# Patient Record
Sex: Female | Born: 1940 | Race: White | Hispanic: No | Smoking: Never smoker
Health system: Southern US, Community
[De-identification: ages and names within clinical notes are randomized; demographics above are authoritative.]

## PROBLEM LIST (undated history)

## (undated) DIAGNOSIS — I10 Essential (primary) hypertension: Secondary | ICD-10-CM

## (undated) DIAGNOSIS — F419 Anxiety disorder, unspecified: Secondary | ICD-10-CM

## (undated) DIAGNOSIS — S42353K Displaced comminuted fracture of shaft of humerus, unspecified arm, subsequent encounter for fracture with nonunion: Secondary | ICD-10-CM

## (undated) DIAGNOSIS — M25559 Pain in unspecified hip: Secondary | ICD-10-CM

## (undated) DIAGNOSIS — G2581 Restless legs syndrome: Secondary | ICD-10-CM

## (undated) DIAGNOSIS — F321 Major depressive disorder, single episode, moderate: Secondary | ICD-10-CM

## (undated) DIAGNOSIS — E559 Vitamin D deficiency, unspecified: Secondary | ICD-10-CM

## (undated) DIAGNOSIS — M1990 Unspecified osteoarthritis, unspecified site: Secondary | ICD-10-CM

## (undated) DIAGNOSIS — G8929 Other chronic pain: Secondary | ICD-10-CM

## (undated) HISTORY — DX: Major depressive disorder, single episode, moderate: F32.1

## (undated) HISTORY — PX: FRACTURE SURGERY: SHX138

---

## 2012-01-27 ENCOUNTER — Emergency Department (HOSPITAL_COMMUNITY): Payer: Medicare Other

## 2012-01-27 ENCOUNTER — Emergency Department (HOSPITAL_COMMUNITY)
Admission: EM | Admit: 2012-01-27 | Discharge: 2012-01-27 | Disposition: A | Discharge status: Home / Self Care | Payer: Medicare Other | Attending: Emergency Medicine | Admitting: Emergency Medicine

## 2012-01-27 ENCOUNTER — Encounter (HOSPITAL_COMMUNITY): Payer: Self-pay | Admitting: *Deleted

## 2012-01-27 DIAGNOSIS — I10 Essential (primary) hypertension: Secondary | ICD-10-CM | POA: Insufficient documentation

## 2012-01-27 DIAGNOSIS — R269 Unspecified abnormalities of gait and mobility: Secondary | ICD-10-CM | POA: Insufficient documentation

## 2012-01-27 DIAGNOSIS — R42 Dizziness and giddiness: Secondary | ICD-10-CM

## 2012-01-27 MED ORDER — MECLIZINE HCL 25 MG PO TABS: 25.0000 mg | ORAL_TABLET | Freq: Four times a day (QID) | ORAL | Status: AC

## 2012-01-27 MED ORDER — MECLIZINE HCL 25 MG PO TABS
25.0000 mg | ORAL_TABLET | Freq: Once | ORAL | Status: AC
  Administered 2012-01-27: 25 mg via ORAL
  Filled 2012-01-27: qty 1

## 2012-01-27 MED ORDER — LORAZEPAM 2 MG/ML IJ SOLN
1.0000 mg | Freq: Once | INTRAMUSCULAR | Status: AC
  Administered 2012-01-27: 1 mg via INTRAVENOUS
  Filled 2012-01-27: qty 1

## 2012-01-27 MED ORDER — LORAZEPAM 1 MG PO TABS: 1.0000 mg | ORAL_TABLET | Freq: Once | ORAL | Status: DC

## 2012-01-27 NOTE — ED Notes (Signed)
Pt's son has stepped out of room stating she is easily stressed out and always has been.  He states she won't go to the doctor for help w/it.

## 2012-01-27 NOTE — ED Notes (Signed)
Pt reports having some dizziness beginning yesterday morning. Got worse this am to where pt reports she was so dizzy she couldn't stand. Pt A&O in triage, sitting in WC.

## 2012-01-27 NOTE — ED Notes (Addendum)
Pt states all she drank yesterday was 2 cups of coffee and a can of diet pepsi which was what she drank Sunday. She did some moving of boxes of clothes and linens on Sunday but wasn't working in the heat at all.  Speech is clear, grips equal, facial symmetry noted. Family at b/s hasn't noticed any neurological abnormalities.

## 2012-01-27 NOTE — ED Provider Notes (Signed)
History     CSN: [id number]  Arrival date & time 01/27/12  1020   First MD Initiated Contact with Patient 01/27/12 1053      Chief Complaint  Patient presents with  . Dizziness    The history is provided by the patient.   the patient reports developing which she describes as "dizziness" yesterday.  She is unable to describe this further except that she feels "drunk".  She reports difficulty ambulating or walking because of instability on her feet.  She has a history of hypertension but stopped taking medications 6 years ago and has not followed up with her primary care physician since then.  She does not know her cholesterol status.  She does not smoke cigarettes.  The last time she was checked is not diabetic.  Denies weakness of her upper lower extremities.  She's had no change in her speech.  Her symptoms do seem to worsen with standing and movement of her head to the left and right.  She has no history of vertigo no prior history of stroke.  Family reports normal mentation this time.  Nothing improves her symptoms.  Her symptoms are constant.  History reviewed. No pertinent past medical history.  History reviewed. No pertinent past surgical history.  No family history on file.  History  Substance Use Topics  . Smoking status: Never Smoker   . Smokeless tobacco: Not on file  . Alcohol Use: No    OB History    Grav Para Term Preterm Abortions TAB SAB Ect Mult Living                  Review of Systems  All other systems reviewed and are negative.    Allergies  Penicillins  Home Medications  No current outpatient prescriptions on file.  BP 165/91  Pulse 84  Temp 98.7 F (37.1 C) (Oral)  Resp 18  SpO2 91%  Physical Exam  Nursing note and vitals reviewed. Constitutional: She is oriented to person, place, and time. She appears well-developed and well-nourished. No distress.  HENT:  Head: Normocephalic and atraumatic.  Eyes: EOM are normal. Pupils are equal,  round, and reactive to light.  Neck: Normal range of motion.  Cardiovascular: Normal rate, regular rhythm and normal heart sounds.   Pulmonary/Chest: Effort normal and breath sounds normal.  Abdominal: Soft. She exhibits no distension. There is no tenderness.  Musculoskeletal: Normal range of motion.  Neurological: She is alert and oriented to person, place, and time.       5/5 strength in major muscle groups of  bilateral upper and lower extremities. Speech normal. No facial asymetry.  Normal finger to nose.  Some gait instability on bedside testing  Skin: Skin is warm and dry.  Psychiatric: She has a normal mood and affect. Judgment normal.    ED Course  Procedures (including critical care time)  Labs Reviewed - No data to display Mr Brain Wo Contrast  01/27/2012  *RADIOLOGY REPORT*  Clinical Data: Sudden onset of dizziness yesterday progressing overnight and this morning.  Unable to any weight due to the dizziness.  MRI HEAD WITHOUT CONTRAST  Technique:  Multiplanar, multiecho pulse sequences of the brain and surrounding structures were obtained according to standard protocol without intravenous contrast.  Comparison: None.  Findings: The diffusion weighted images demonstrate no evidence for acute or subacute infarction.  Mild confluent periventricular and scattered subcortical T2 and FLAIR hyperintensities are present bilaterally.  Mild generalized atrophy is noted.  The ventricles  are proportionate to the degree of atrophy.  No significant extra- axial fluid collections are present.  There is no hemorrhage or mass lesion.  Flow is present in the major intracranial arteries.  The globes and orbits are intact.  Mild mucosal thickening is present in the ethmoid air cells.  The paranasal sinuses are otherwise clear.  There is fluid in the left mastoid air cells.  No obstructing nasopharyngeal lesion is evident.  IMPRESSION:  1.  No acute or focal abnormality to explain the patient's dizziness. 2.   Moderate atrophy and diffuse white matter changes likely reflecting sequelae of chronic microvascular ischemia. 3.  Left mastoid effusion.  No obstructing nasopharyngeal lesion is evident.  Original Report Authenticated By: Jamesetta Orleans. MATTERN, M.D.     I personally reviewed the imaging tests through PACS system  I reviewed available ER/hospitalization records thought the EMR   1. Vertigo       MDM  Given gait instability and symptoms of dizziness without a clear etiology besides peripheral vertigo the MRI scan will be obtained to evaluate for possible posterior stroke.  My suspicion is low at this point but high enough to obtain images.  The patient be given Ativan and Antivert at this time  2:15 PM The patient feels much better at this time.  Her MRI demonstrates no evidence of acute stroke.  Discharge home in good condition.  Home with Antivert  Lyanne Co, MD 01/27/12 1415

## 2012-01-27 NOTE — ED Notes (Signed)
Patient transported back from MRI on stretcher.  Now she is up w/ standby assist to use the bathroom.

## 2012-01-29 ENCOUNTER — Emergency Department (HOSPITAL_COMMUNITY): Payer: Medicare Other

## 2012-01-29 ENCOUNTER — Emergency Department (HOSPITAL_COMMUNITY)
Admission: EM | Admit: 2012-01-29 | Discharge: 2012-01-29 | Disposition: A | Discharge status: Home / Self Care | Payer: Medicare Other | Attending: Emergency Medicine | Admitting: Emergency Medicine

## 2012-01-29 ENCOUNTER — Encounter (HOSPITAL_COMMUNITY): Payer: Self-pay | Admitting: Emergency Medicine

## 2012-01-29 DIAGNOSIS — R42 Dizziness and giddiness: Secondary | ICD-10-CM

## 2012-01-29 DIAGNOSIS — Z79899 Other long term (current) drug therapy: Secondary | ICD-10-CM | POA: Insufficient documentation

## 2012-01-29 HISTORY — DX: Unspecified osteoarthritis, unspecified site: M19.90

## 2012-01-29 LAB — CBC WITH DIFFERENTIAL/PLATELET
Hemoglobin: 14.3 g/dL (ref 12.0–15.0)
Lymphs Abs: 2.3 10*3/uL (ref 0.7–4.0)
Monocytes Relative: 12 % (ref 3–12)
Neutro Abs: 3.9 10*3/uL (ref 1.7–7.7)
Neutrophils Relative %: 54 % (ref 43–77)
RBC: 5.01 MIL/uL (ref 3.87–5.11)

## 2012-01-29 LAB — URINALYSIS, ROUTINE W REFLEX MICROSCOPIC
Bilirubin Urine: NEGATIVE
Glucose, UA: NEGATIVE mg/dL
Ketones, ur: NEGATIVE mg/dL
Leukocytes, UA: NEGATIVE
Protein, ur: NEGATIVE mg/dL

## 2012-01-29 LAB — COMPREHENSIVE METABOLIC PANEL
Albumin: 3.9 g/dL (ref 3.5–5.2)
Alkaline Phosphatase: 112 U/L (ref 39–117)
BUN: 10 mg/dL (ref 6–23)
Chloride: 105 mEq/L (ref 96–112)
Glucose, Bld: 106 mg/dL — ABNORMAL HIGH (ref 70–99)
Potassium: 3.8 mEq/L (ref 3.5–5.1)
Total Bilirubin: 0.3 mg/dL (ref 0.3–1.2)

## 2012-01-29 MED ORDER — SODIUM CHLORIDE 0.9 % IV BOLUS (SEPSIS)
1000.0000 mL | Freq: Once | INTRAVENOUS | Status: AC
  Administered 2012-01-29: 1000 mL via INTRAVENOUS

## 2012-01-29 MED ORDER — LORAZEPAM 2 MG/ML IJ SOLN
1.0000 mg | Freq: Once | INTRAMUSCULAR | Status: AC
  Administered 2012-01-29: 1 mg via INTRAVENOUS
  Filled 2012-01-29: qty 1

## 2012-01-29 MED ORDER — LORAZEPAM 1 MG PO TABS: 1.0000 mg | ORAL_TABLET | Freq: Three times a day (TID) | ORAL | Status: AC | PRN

## 2012-01-29 NOTE — ED Notes (Signed)
Patient transported to XR. 

## 2012-01-29 NOTE — ED Provider Notes (Signed)
History     CSN: [id number]  Arrival date & time 01/29/12  1226   First MD Initiated Contact with Patient 01/29/12 1231      Chief Complaint  Patient presents with  . Dizziness    (Consider location/radiation/quality/duration/timing/severity/associated sxs/prior treatment) HPI The patient presents with dizziness.  She notes that symptoms began 3 days ago, insidiously.  Since onset she has had persistent generalized sense of discomfort.  She notes that her symptoms are most prominent when trying to ambulate or move.  She describes a sense of disequilibrium without near-syncope.  She denies any focal pain anywhere.  Her symptoms were mildly improved with interventions she received during her first emergency department visit 2 days ago.  She notes that since that visit her symptoms have been persistent.  She denies any concurrent confusion, disorientation him a hearing loss, nausea, vomiting, diarrhea.  Past Medical History  Diagnosis Date  . Arthritis     History reviewed. No pertinent past surgical history.  No family history on file.  History  Substance Use Topics  . Smoking status: Never Smoker   . Smokeless tobacco: Not on file  . Alcohol Use: No    OB History    Grav Para Term Preterm Abortions TAB SAB Ect Mult Living                  Review of Systems  Constitutional:       HPI  HENT:       HPI otherwise negative  Eyes: Negative.   Respiratory:       HPI, otherwise negative  Cardiovascular:       HPI, otherwise nmegative  Gastrointestinal: Negative for vomiting.  Genitourinary:       HPI, otherwise negative  Musculoskeletal:       HPI, otherwise negative  Skin: Negative.   Neurological: Positive for dizziness. Negative for tremors, seizures, syncope, facial asymmetry, speech difficulty, weakness, light-headedness, numbness and headaches.    Allergies  Penicillins  Home Medications   Current Outpatient Rx  Name Route Sig Dispense Refill  .  MECLIZINE HCL 25 MG PO TABS Oral Take 1 tablet (25 mg total) by mouth 4 (four) times daily. 28 tablet 0    BP 163/106  Pulse 113  Temp 97.9 F (36.6 C) (Oral)  Resp 20  SpO2 94%  Physical Exam  Nursing note and vitals reviewed. Constitutional: She is oriented to person, place, and time. She appears well-developed and well-nourished. No distress.       Obese elderly female  HENT:  Head: Normocephalic and atraumatic.  Eyes: EOM are normal. Pupils are equal, round, and reactive to light.  Neck: Normal range of motion.  Cardiovascular: Normal rate, regular rhythm and normal heart sounds.   Pulmonary/Chest: Effort normal and breath sounds normal.  Abdominal: Soft. She exhibits no distension. There is no tenderness.  Musculoskeletal: Normal range of motion.  Neurological: She is alert and oriented to person, place, and time. No cranial nerve deficit. She exhibits normal muscle tone. Coordination normal.       5/5 strength in major muscle groups of  bilateral upper and lower extremities. Speech normal. No facial asymetry.  Normal finger to nose.  Some gait instability on bedside testing, and if the patient is unwilling to take more than minimal steps  Skin: Skin is warm and dry.  Psychiatric: She has a normal mood and affect. Judgment normal.    ED Course  Procedures (including critical care time)   Labs  Reviewed  CBC WITH DIFFERENTIAL  COMPREHENSIVE METABOLIC PANEL  URINALYSIS, ROUTINE W REFLEX MICROSCOPIC  TROPONIN I   Mr Brain Wo Contrast  01/27/2012  *RADIOLOGY REPORT*  Clinical Data: Sudden onset of dizziness yesterday progressing overnight and this morning.  Unable to any weight due to the dizziness.  MRI HEAD WITHOUT CONTRAST  Technique:  Multiplanar, multiecho pulse sequences of the brain and surrounding structures were obtained according to standard protocol without intravenous contrast.  Comparison: None.  Findings: The diffusion weighted images demonstrate no evidence for  acute or subacute infarction.  Mild confluent periventricular and scattered subcortical T2 and FLAIR hyperintensities are present bilaterally.  Mild generalized atrophy is noted.  The ventricles are proportionate to the degree of atrophy.  No significant extra- axial fluid collections are present.  There is no hemorrhage or mass lesion.  Flow is present in the major intracranial arteries.  The globes and orbits are intact.  Mild mucosal thickening is present in the ethmoid air cells.  The paranasal sinuses are otherwise clear.  There is fluid in the left mastoid air cells.  No obstructing nasopharyngeal lesion is evident.  IMPRESSION:  1.  No acute or focal abnormality to explain the patient's dizziness. 2.  Moderate atrophy and diffuse white matter changes likely reflecting sequelae of chronic microvascular ischemia. 3.  Left mastoid effusion.  No obstructing nasopharyngeal lesion is evident.  Original Report Authenticated By: Jamesetta Orleans. MATTERN, M.D.     No diagnosis found.  Cardiac 91 sinus rhythm normal Pulse oximetry 100% room air normal   Date: 01/29/2012  Rate: 98  Rhythm: normal sinus rhythm  QRS Axis: left  Intervals: PR prolonged  ST/T Wave abnormalities: nonspecific T wave changes  Conduction Disutrbances:left anterior fascicular block  Narrative Interpretation:   Old EKG Reviewed: changes noted  ABNORMAL  1:09 PM The patient does not tolerate blood pressure cuff.  3:32 PM Patient notes that her dysequilibrium is gone.  MDM  This 71 year old female presents for second time in several days with concerns of ongoing disequilibrium.  On my exam the patient was in no distress, with an unremarkable neurologic exam.  The patient was hesitant to walk to 2 stated instability, but can be arranged, can take a few steps.  Given that the patient already had an MRI, and notes no new confusion or focal neurologic changes, this exam was not repeated.  The patient's evaluation for vertigo  was completed with labs, chest x-ray, ecg.  All of these results were reassuring.  The patient had significant improvement with ED interventions.  Given this improvement, the absence of acute findings, the patient is appropriate to continue the evaluation of her vertigo as an outpatient.  I spent a considerable time discussing all findings with the patient and her son.  The patient's son requests blood pressure medication, the patient's pressures here have been mildly hypertensive at best.  The patient notes that her blood pressure at home is typically 120/80.  She was provided with pressure medication.  She was given return precautions, follow up instructions, and discharged in stable condition.  Requested something for anxiety and control of symptoms.  Ativan will provide this as well as control of her vertigo.  Gerhard Munch, MD 01/29/12 1536

## 2012-01-29 NOTE — ED Notes (Signed)
Pt presenting to ed with c/o dizziness pt states she was seen here x 2 days ago for the same symptoms. Pt states some blurred vision. Pt denies nausea and vomiting. Pt states she can not ambulate. Pt states she was given antivert but it's not helping

## 2012-07-14 HISTORY — PX: CATARACT EXTRACTION W/ INTRAOCULAR LENS IMPLANT: SHX1309

## 2013-06-18 IMAGING — CR DG CHEST 2V
2 series · 2 of 2 positions shown · non-contrast
Comparison: None.

CLINICAL DATA: Discomfort and dizziness.

CHEST - 2 VIEW

[w chest pa]
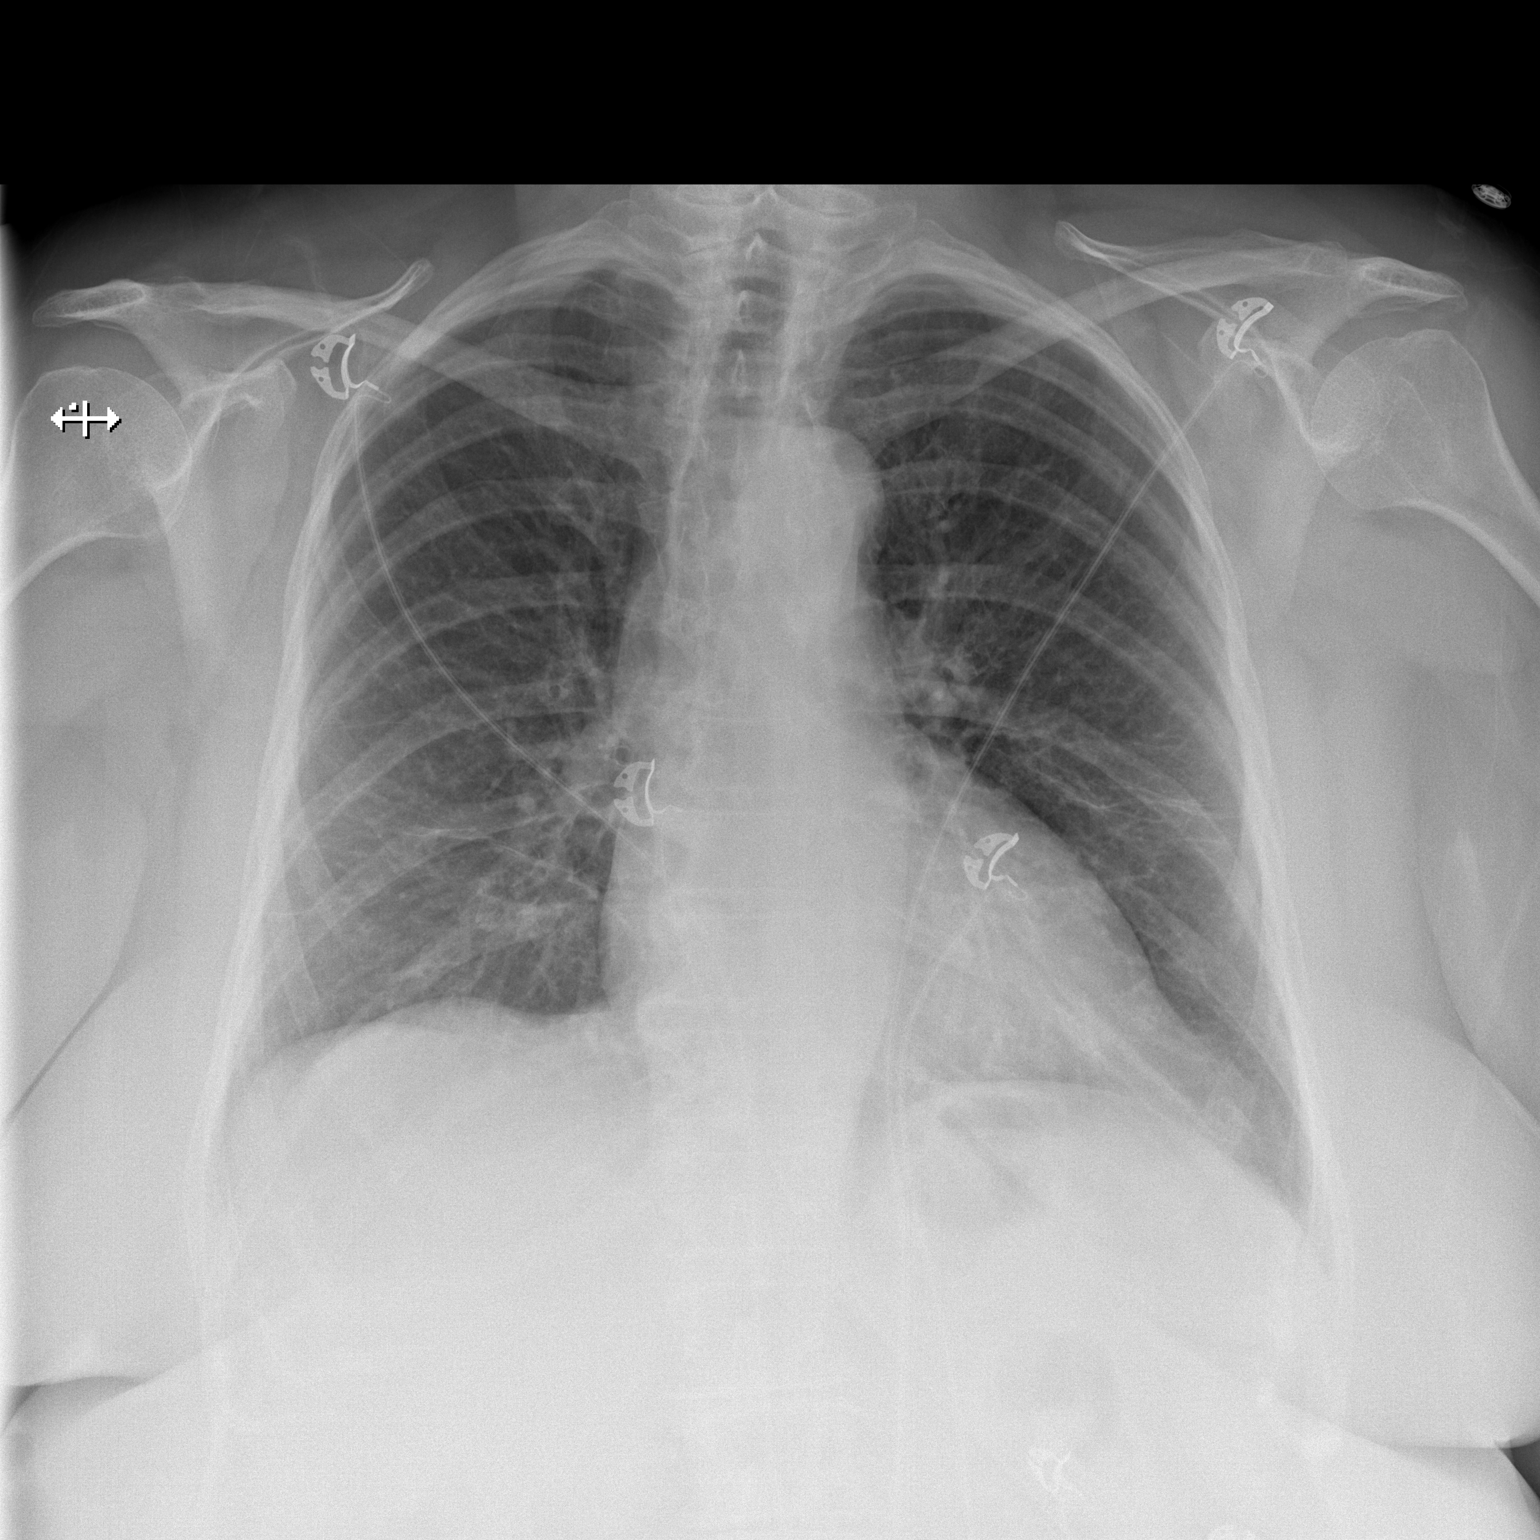

[w chest lat]
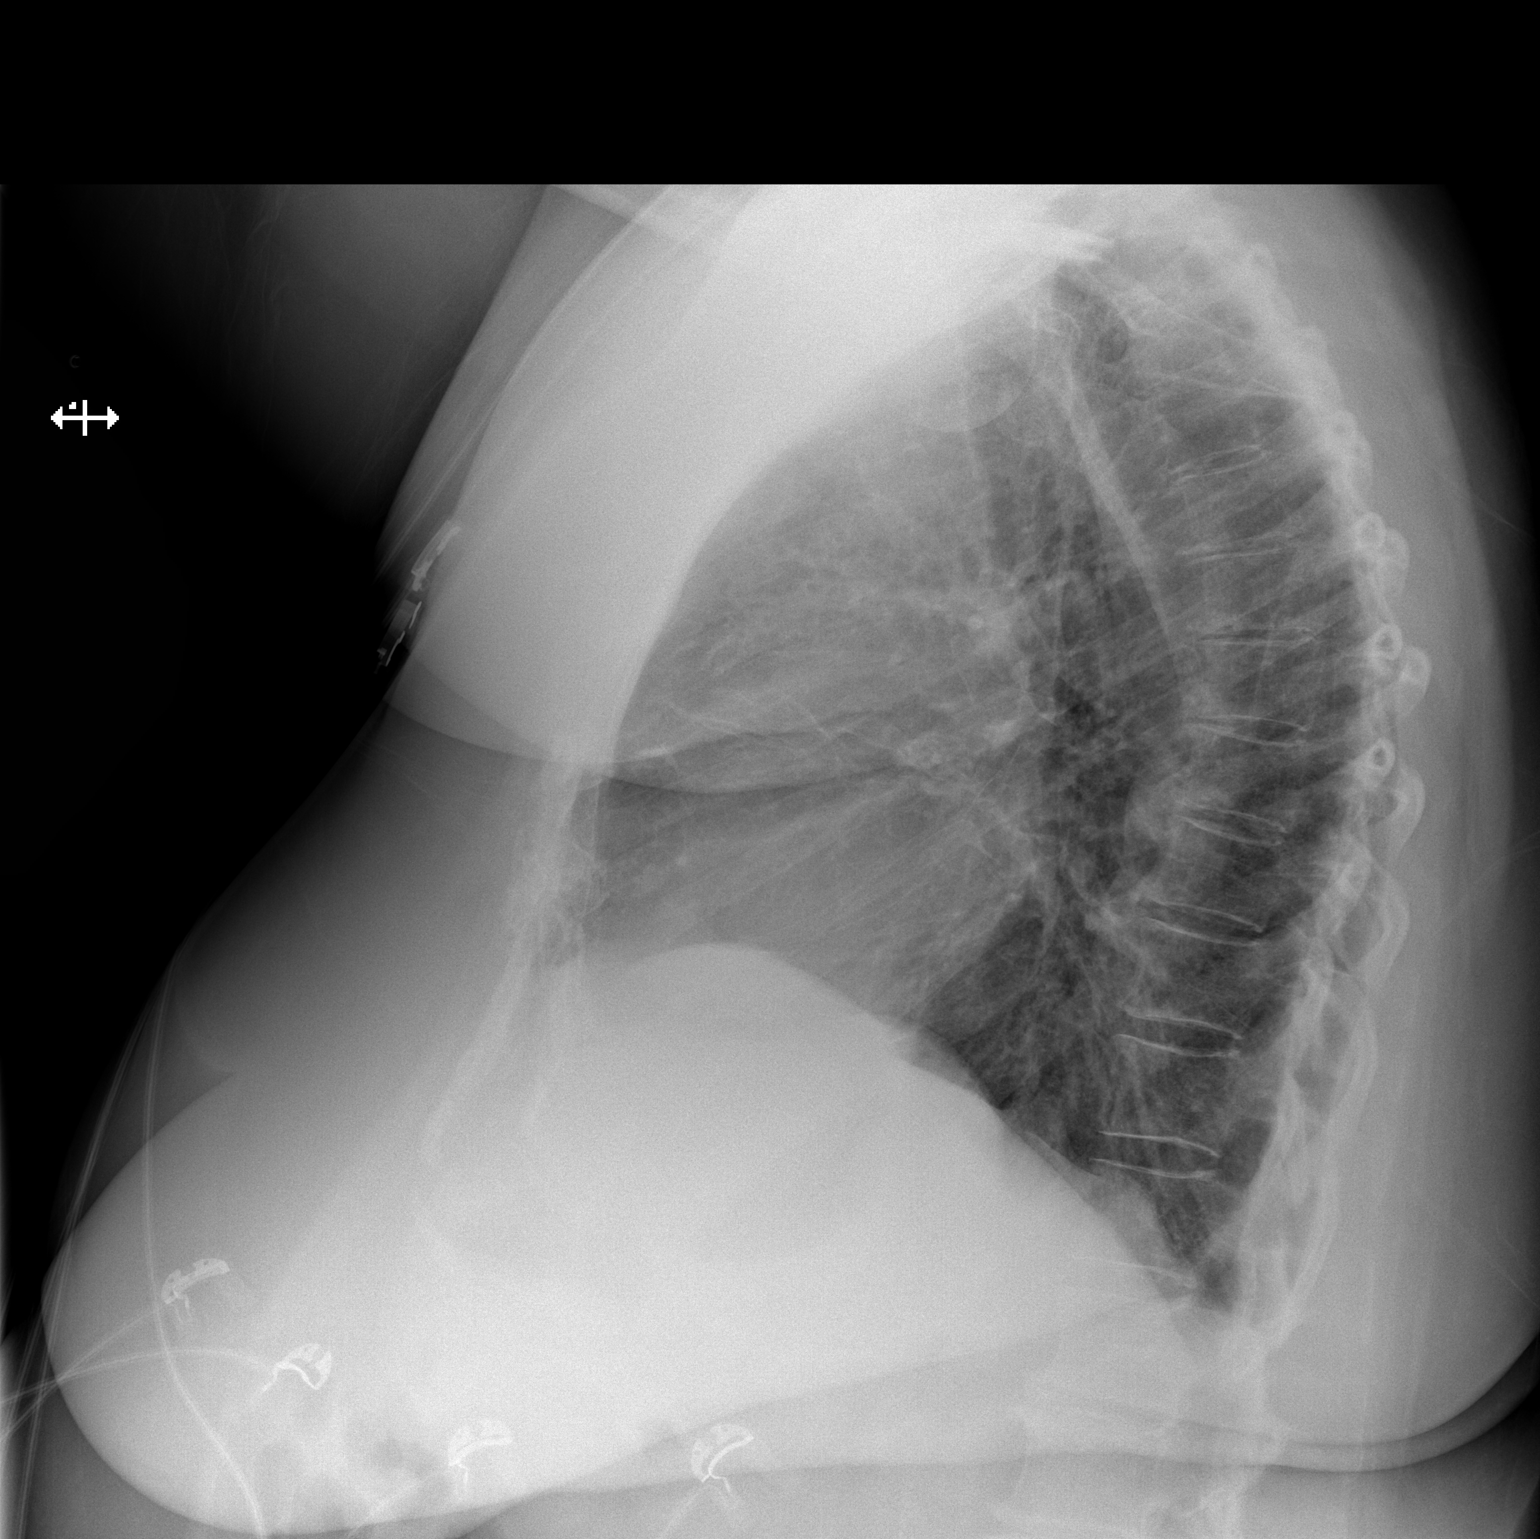

[2 of 2 positions shown; findings below may reference images not displayed]

FINDINGS: Trachea is midline.  Heart size normal.  Linear scarring
in the mid and lower lung zones.  Lungs are otherwise clear.  No
pleural fluid.
IMPRESSION: No acute findings.

## 2016-04-25 ENCOUNTER — Encounter (HOSPITAL_COMMUNITY): Payer: Self-pay | Admitting: *Deleted

## 2016-04-25 DIAGNOSIS — M898X9 Other specified disorders of bone, unspecified site: Secondary | ICD-10-CM | POA: Diagnosis present

## 2016-04-25 DIAGNOSIS — R0902 Hypoxemia: Secondary | ICD-10-CM | POA: Diagnosis present

## 2016-04-25 DIAGNOSIS — S42201A Unspecified fracture of upper end of right humerus, initial encounter for closed fracture: Principal | ICD-10-CM | POA: Diagnosis present

## 2016-04-25 DIAGNOSIS — E559 Vitamin D deficiency, unspecified: Secondary | ICD-10-CM | POA: Diagnosis present

## 2016-04-25 DIAGNOSIS — I10 Essential (primary) hypertension: Secondary | ICD-10-CM | POA: Diagnosis present

## 2016-04-25 DIAGNOSIS — W1830XA Fall on same level, unspecified, initial encounter: Secondary | ICD-10-CM | POA: Diagnosis present

## 2016-04-25 DIAGNOSIS — G2581 Restless legs syndrome: Secondary | ICD-10-CM | POA: Diagnosis present

## 2016-04-25 DIAGNOSIS — B9620 Unspecified Escherichia coli [E. coli] as the cause of diseases classified elsewhere: Secondary | ICD-10-CM | POA: Diagnosis present

## 2016-04-25 DIAGNOSIS — Z880 Allergy status to penicillin: Secondary | ICD-10-CM

## 2016-04-25 DIAGNOSIS — D62 Acute posthemorrhagic anemia: Secondary | ICD-10-CM | POA: Diagnosis not present

## 2016-04-25 DIAGNOSIS — N390 Urinary tract infection, site not specified: Secondary | ICD-10-CM | POA: Diagnosis present

## 2016-04-25 DIAGNOSIS — Z6841 Body Mass Index (BMI) 40.0 and over, adult: Secondary | ICD-10-CM

## 2016-04-28 ENCOUNTER — Ambulatory Visit (HOSPITAL_COMMUNITY): Payer: Medicare Other

## 2016-04-28 ENCOUNTER — Encounter (HOSPITAL_COMMUNITY)
Admission: RE | Disposition: A | Discharge status: Skilled Nursing Facility | Payer: Self-pay | Source: Ambulatory Visit | Attending: Orthopedic Surgery

## 2016-04-28 ENCOUNTER — Encounter (HOSPITAL_COMMUNITY): Payer: Self-pay | Admitting: Anesthesiology

## 2016-04-28 ENCOUNTER — Ambulatory Visit (HOSPITAL_COMMUNITY): Payer: Medicare Other | Admitting: Anesthesiology

## 2016-04-28 ENCOUNTER — Inpatient Hospital Stay (HOSPITAL_COMMUNITY)
Admission: RE | Admit: 2016-04-28 | Discharge: 2016-04-30 | DRG: 493 | Disposition: A | Discharge status: Skilled Nursing Facility | Payer: Medicare Other | Source: Ambulatory Visit | Attending: Orthopedic Surgery | Admitting: Orthopedic Surgery

## 2016-04-28 ENCOUNTER — Inpatient Hospital Stay (HOSPITAL_COMMUNITY): Payer: Medicare Other

## 2016-04-28 DIAGNOSIS — S42351A Displaced comminuted fracture of shaft of humerus, right arm, initial encounter for closed fracture: Secondary | ICD-10-CM | POA: Diagnosis present

## 2016-04-28 DIAGNOSIS — S42201A Unspecified fracture of upper end of right humerus, initial encounter for closed fracture: Secondary | ICD-10-CM | POA: Diagnosis present

## 2016-04-28 DIAGNOSIS — N390 Urinary tract infection, site not specified: Secondary | ICD-10-CM | POA: Diagnosis not present

## 2016-04-28 DIAGNOSIS — R0902 Hypoxemia: Secondary | ICD-10-CM | POA: Diagnosis not present

## 2016-04-28 DIAGNOSIS — D62 Acute posthemorrhagic anemia: Secondary | ICD-10-CM | POA: Diagnosis not present

## 2016-04-28 DIAGNOSIS — E559 Vitamin D deficiency, unspecified: Secondary | ICD-10-CM | POA: Diagnosis present

## 2016-04-28 DIAGNOSIS — G2581 Restless legs syndrome: Secondary | ICD-10-CM | POA: Diagnosis not present

## 2016-04-28 DIAGNOSIS — Z6841 Body Mass Index (BMI) 40.0 and over, adult: Secondary | ICD-10-CM | POA: Diagnosis not present

## 2016-04-28 DIAGNOSIS — Z01811 Encounter for preprocedural respiratory examination: Secondary | ICD-10-CM

## 2016-04-28 DIAGNOSIS — F419 Anxiety disorder, unspecified: Secondary | ICD-10-CM | POA: Diagnosis present

## 2016-04-28 DIAGNOSIS — I10 Essential (primary) hypertension: Secondary | ICD-10-CM | POA: Diagnosis present

## 2016-04-28 DIAGNOSIS — Z419 Encounter for procedure for purposes other than remedying health state, unspecified: Secondary | ICD-10-CM

## 2016-04-28 DIAGNOSIS — B9620 Unspecified Escherichia coli [E. coli] as the cause of diseases classified elsewhere: Secondary | ICD-10-CM | POA: Diagnosis not present

## 2016-04-28 DIAGNOSIS — S42353K Displaced comminuted fracture of shaft of humerus, unspecified arm, subsequent encounter for fracture with nonunion: Secondary | ICD-10-CM | POA: Diagnosis present

## 2016-04-28 DIAGNOSIS — W1830XA Fall on same level, unspecified, initial encounter: Secondary | ICD-10-CM | POA: Diagnosis not present

## 2016-04-28 DIAGNOSIS — M898X9 Other specified disorders of bone, unspecified site: Secondary | ICD-10-CM | POA: Diagnosis not present

## 2016-04-28 DIAGNOSIS — Z880 Allergy status to penicillin: Secondary | ICD-10-CM | POA: Diagnosis not present

## 2016-04-28 HISTORY — DX: Pain in unspecified hip: M25.559

## 2016-04-28 HISTORY — PX: ORIF HUMERUS FRACTURE: SHX2126

## 2016-04-28 HISTORY — DX: Essential (primary) hypertension: I10

## 2016-04-28 HISTORY — DX: Displaced comminuted fracture of shaft of humerus, unspecified arm, subsequent encounter for fracture with nonunion: S42.353K

## 2016-04-28 HISTORY — DX: Displaced comminuted fracture of shaft of humerus, right arm, initial encounter for closed fracture: S42.351A

## 2016-04-28 HISTORY — DX: Anxiety disorder, unspecified: F41.9

## 2016-04-28 HISTORY — DX: Other chronic pain: G89.29

## 2016-04-28 HISTORY — DX: Vitamin D deficiency, unspecified: E55.9

## 2016-04-28 HISTORY — DX: Morbid (severe) obesity due to excess calories: E66.01

## 2016-04-28 HISTORY — DX: Restless legs syndrome: G25.81

## 2016-04-28 LAB — CBC WITH DIFFERENTIAL/PLATELET
BASOS PCT: 0 %
Basophils Absolute: 0 10*3/uL (ref 0.0–0.1)
Eosinophils Absolute: 0.1 10*3/uL (ref 0.0–0.7)
Eosinophils Relative: 2 %
HEMATOCRIT: 39.1 % (ref 36.0–46.0)
Hemoglobin: 12.2 g/dL (ref 12.0–15.0)
Lymphocytes Relative: 29 %
Lymphs Abs: 2.2 10*3/uL (ref 0.7–4.0)
MCH: 28.3 pg (ref 26.0–34.0)
MCHC: 31.2 g/dL (ref 30.0–36.0)
MCV: 90.7 fL (ref 78.0–100.0)
MONO ABS: 0.7 10*3/uL (ref 0.1–1.0)
MONOS PCT: 10 %
NEUTROS ABS: 4.5 10*3/uL (ref 1.7–7.7)
Neutrophils Relative %: 59 %
Platelets: 277 10*3/uL (ref 150–400)
RBC: 4.31 MIL/uL (ref 3.87–5.11)
RDW: 15 % (ref 11.5–15.5)
WBC: 7.6 10*3/uL (ref 4.0–10.5)

## 2016-04-28 LAB — URINALYSIS, ROUTINE W REFLEX MICROSCOPIC
BILIRUBIN URINE: NEGATIVE
Glucose, UA: NEGATIVE mg/dL
HGB URINE DIPSTICK: NEGATIVE
Ketones, ur: NEGATIVE mg/dL
Leukocytes, UA: NEGATIVE
Nitrite: POSITIVE — AB
PROTEIN: NEGATIVE mg/dL
Specific Gravity, Urine: 1.018 (ref 1.005–1.030)
pH: 7 (ref 5.0–8.0)

## 2016-04-28 LAB — URINE MICROSCOPIC-ADD ON

## 2016-04-28 LAB — COMPREHENSIVE METABOLIC PANEL
ALBUMIN: 3.4 g/dL — AB (ref 3.5–5.0)
ALT: 26 U/L (ref 14–54)
ANION GAP: 7 (ref 5–15)
AST: 20 U/L (ref 15–41)
Alkaline Phosphatase: 161 U/L — ABNORMAL HIGH (ref 38–126)
BILIRUBIN TOTAL: 0.7 mg/dL (ref 0.3–1.2)
BUN: 10 mg/dL (ref 6–20)
CO2: 26 mmol/L (ref 22–32)
Calcium: 8.5 mg/dL — ABNORMAL LOW (ref 8.9–10.3)
Chloride: 106 mmol/L (ref 101–111)
Creatinine, Ser: 0.73 mg/dL (ref 0.44–1.00)
Glucose, Bld: 101 mg/dL — ABNORMAL HIGH (ref 65–99)
POTASSIUM: 4.1 mmol/L (ref 3.5–5.1)
Sodium: 139 mmol/L (ref 135–145)
TOTAL PROTEIN: 7 g/dL (ref 6.5–8.1)

## 2016-04-28 LAB — APTT: APTT: 29 s (ref 24–36)

## 2016-04-28 LAB — PROTIME-INR
INR: 1.05
PROTHROMBIN TIME: 13.7 s (ref 11.4–15.2)

## 2016-04-28 LAB — PHOSPHORUS: Phosphorus: 2.9 mg/dL (ref 2.5–4.6)

## 2016-04-28 LAB — MAGNESIUM: MAGNESIUM: 2 mg/dL (ref 1.7–2.4)

## 2016-04-28 SURGERY — OPEN REDUCTION INTERNAL FIXATION (ORIF) PROXIMAL HUMERUS FRACTURE
Anesthesia: Regional | Laterality: Right

## 2016-04-28 MED ORDER — LIDOCAINE 2% (20 MG/ML) 5 ML SYRINGE
INTRAMUSCULAR | Status: AC
  Filled 2016-04-28: qty 5

## 2016-04-28 MED ORDER — SUGAMMADEX SODIUM 200 MG/2ML IV SOLN
INTRAVENOUS | Status: AC
  Filled 2016-04-28: qty 2

## 2016-04-28 MED ORDER — CLINDAMYCIN PHOSPHATE 900 MG/50ML IV SOLN
900.0000 mg | INTRAVENOUS | Status: AC
  Administered 2016-04-28: 900 mg via INTRAVENOUS
  Filled 2016-04-28: qty 50

## 2016-04-28 MED ORDER — ONDANSETRON HCL 4 MG/2ML IJ SOLN: 4.0000 mg | Freq: Four times a day (QID) | INTRAMUSCULAR | Status: DC | PRN

## 2016-04-28 MED ORDER — PROMETHAZINE HCL 25 MG/ML IJ SOLN
6.2500 mg | INTRAMUSCULAR | Status: DC | PRN
Start: 2016-04-28 — End: 2016-04-28

## 2016-04-28 MED ORDER — PROPOFOL 10 MG/ML IV BOLUS
INTRAVENOUS | Status: AC
  Filled 2016-04-28: qty 20

## 2016-04-28 MED ORDER — ACETAMINOPHEN 500 MG PO TABS
1000.0000 mg | ORAL_TABLET | Freq: Once | ORAL | Status: AC
  Administered 2016-04-28: 1000 mg via ORAL
  Filled 2016-04-28: qty 2

## 2016-04-28 MED ORDER — FENTANYL CITRATE (PF) 100 MCG/2ML IJ SOLN
INTRAMUSCULAR | Status: AC
  Filled 2016-04-28: qty 2

## 2016-04-28 MED ORDER — ACETAMINOPHEN 325 MG PO TABS: 650.0000 mg | ORAL_TABLET | Freq: Four times a day (QID) | ORAL | Status: DC | PRN

## 2016-04-28 MED ORDER — PHENOL 1.4 % MT LIQD: 1.0000 | OROMUCOSAL | Status: DC | PRN

## 2016-04-28 MED ORDER — PROPOFOL 10 MG/ML IV BOLUS
INTRAVENOUS | Status: DC | PRN
  Administered 2016-04-28: 200 mg via INTRAVENOUS

## 2016-04-28 MED ORDER — PHENYLEPHRINE HCL 10 MG/ML IJ SOLN
INTRAMUSCULAR | Status: DC | PRN
  Administered 2016-04-28: 80 ug via INTRAVENOUS

## 2016-04-28 MED ORDER — ALUM & MAG HYDROXIDE-SIMETH 200-200-20 MG/5ML PO SUSP: 30.0000 mL | ORAL | Status: DC | PRN

## 2016-04-28 MED ORDER — MORPHINE SULFATE (PF) 2 MG/ML IV SOLN: 1.0000 mg | INTRAVENOUS | Status: DC | PRN

## 2016-04-28 MED ORDER — ROCURONIUM BROMIDE 100 MG/10ML IV SOLN
INTRAVENOUS | Status: DC | PRN
  Administered 2016-04-28: 50 mg via INTRAVENOUS
  Administered 2016-04-28: 20 mg via INTRAVENOUS

## 2016-04-28 MED ORDER — ONDANSETRON HCL 4 MG/2ML IJ SOLN
INTRAMUSCULAR | Status: AC
  Filled 2016-04-28: qty 2

## 2016-04-28 MED ORDER — BUPIVACAINE-EPINEPHRINE (PF) 0.5% -1:200000 IJ SOLN
INTRAMUSCULAR | Status: DC | PRN
  Administered 2016-04-28: 20 mL via PERINEURAL

## 2016-04-28 MED ORDER — POTASSIUM CHLORIDE IN NACL 20-0.9 MEQ/L-% IV SOLN
INTRAVENOUS | Status: DC
  Administered 2016-04-29 (×2): via INTRAVENOUS
  Filled 2016-04-28 (×4): qty 1000

## 2016-04-28 MED ORDER — ACETAMINOPHEN 650 MG RE SUPP: 650.0000 mg | Freq: Four times a day (QID) | RECTAL | Status: DC | PRN

## 2016-04-28 MED ORDER — PHENYLEPHRINE HCL 10 MG/ML IJ SOLN
INTRAVENOUS | Status: DC | PRN
  Administered 2016-04-28: 50 ug/min via INTRAVENOUS

## 2016-04-28 MED ORDER — OXYCODONE-ACETAMINOPHEN 5-325 MG PO TABS
1.0000 | ORAL_TABLET | Freq: Four times a day (QID) | ORAL | Status: DC | PRN
  Administered 2016-04-29: 1 via ORAL
  Filled 2016-04-28: qty 1

## 2016-04-28 MED ORDER — MIDAZOLAM HCL 2 MG/2ML IJ SOLN
INTRAMUSCULAR | Status: AC
  Filled 2016-04-28: qty 2

## 2016-04-28 MED ORDER — CHLORHEXIDINE GLUCONATE 4 % EX LIQD: 60.0000 mL | Freq: Once | CUTANEOUS | Status: DC

## 2016-04-28 MED ORDER — SUCCINYLCHOLINE CHLORIDE 20 MG/ML IJ SOLN
INTRAMUSCULAR | Status: DC | PRN
  Administered 2016-04-28: 120 mg via INTRAVENOUS

## 2016-04-28 MED ORDER — SUGAMMADEX SODIUM 200 MG/2ML IV SOLN
INTRAVENOUS | Status: DC | PRN
  Administered 2016-04-28: 200 mg via INTRAVENOUS

## 2016-04-28 MED ORDER — ENOXAPARIN SODIUM 40 MG/0.4ML ~~LOC~~ SOLN
40.0000 mg | SUBCUTANEOUS | Status: DC
  Administered 2016-04-29: 40 mg via SUBCUTANEOUS
  Filled 2016-04-28: qty 0.4

## 2016-04-28 MED ORDER — PHENYLEPHRINE 40 MCG/ML (10ML) SYRINGE FOR IV PUSH (FOR BLOOD PRESSURE SUPPORT)
PREFILLED_SYRINGE | INTRAVENOUS | Status: AC
  Filled 2016-04-28: qty 10

## 2016-04-28 MED ORDER — SUCCINYLCHOLINE CHLORIDE 200 MG/10ML IV SOSY
PREFILLED_SYRINGE | INTRAVENOUS | Status: AC
  Filled 2016-04-28: qty 10

## 2016-04-28 MED ORDER — OXYCODONE HCL 5 MG PO TABS
5.0000 mg | ORAL_TABLET | ORAL | Status: DC | PRN
  Administered 2016-04-29 (×2): 5 mg via ORAL
  Filled 2016-04-28 (×2): qty 1

## 2016-04-28 MED ORDER — 0.9 % SODIUM CHLORIDE (POUR BTL) OPTIME
TOPICAL | Status: DC | PRN
  Administered 2016-04-28: 1000 mL

## 2016-04-28 MED ORDER — FENTANYL CITRATE (PF) 100 MCG/2ML IJ SOLN
100.0000 ug | Freq: Once | INTRAMUSCULAR | Status: AC
  Administered 2016-04-28: 100 ug via INTRAVENOUS
  Filled 2016-04-28: qty 2

## 2016-04-28 MED ORDER — MENTHOL 3 MG MT LOZG: 1.0000 | LOZENGE | OROMUCOSAL | Status: DC | PRN

## 2016-04-28 MED ORDER — ROCURONIUM BROMIDE 10 MG/ML (PF) SYRINGE
PREFILLED_SYRINGE | INTRAVENOUS | Status: AC
  Filled 2016-04-28: qty 10

## 2016-04-28 MED ORDER — METOCLOPRAMIDE HCL 5 MG/ML IJ SOLN: 5.0000 mg | Freq: Three times a day (TID) | INTRAMUSCULAR | Status: DC | PRN

## 2016-04-28 MED ORDER — ONDANSETRON HCL 4 MG PO TABS: 4.0000 mg | ORAL_TABLET | Freq: Four times a day (QID) | ORAL | Status: DC | PRN

## 2016-04-28 MED ORDER — CLINDAMYCIN PHOSPHATE 600 MG/50ML IV SOLN
600.0000 mg | Freq: Four times a day (QID) | INTRAVENOUS | Status: AC
  Administered 2016-04-29 (×3): 600 mg via INTRAVENOUS
  Filled 2016-04-28 (×3): qty 50

## 2016-04-28 MED ORDER — METOCLOPRAMIDE HCL 10 MG PO TABS: 5.0000 mg | ORAL_TABLET | Freq: Three times a day (TID) | ORAL | Status: DC | PRN

## 2016-04-28 MED ORDER — DOCUSATE SODIUM 100 MG PO CAPS
100.0000 mg | ORAL_CAPSULE | Freq: Two times a day (BID) | ORAL | Status: DC
  Administered 2016-04-29 – 2016-04-30 (×4): 100 mg via ORAL
  Filled 2016-04-28 (×4): qty 1

## 2016-04-28 MED ORDER — ONDANSETRON HCL 4 MG/2ML IJ SOLN
INTRAMUSCULAR | Status: DC | PRN
  Administered 2016-04-28: 4 mg via INTRAVENOUS

## 2016-04-28 MED ORDER — LACTATED RINGERS IV SOLN
INTRAVENOUS | Status: DC
  Administered 2016-04-28 (×2): via INTRAVENOUS

## 2016-04-28 MED ORDER — HYDROMORPHONE HCL 1 MG/ML IJ SOLN: 0.2500 mg | INTRAMUSCULAR | Status: DC | PRN

## 2016-04-28 SURGICAL SUPPLY — 82 items
BENZOIN TINCTURE PRP APPL 2/3 (GAUZE/BANDAGES/DRESSINGS) ×6 IMPLANT
BIT DRILL 2.5X2.75 QC CALB (BIT) ×3 IMPLANT
BIT DRILL 3.5X5.5 QC CALB (BIT) ×3 IMPLANT
BIT DRILL 4 LONG FAST STEP (BIT) ×3 IMPLANT
BIT DRILL 4 SHORT FAST STEP (BIT) ×3 IMPLANT
BNDG GAUZE ELAST 4 BULKY (GAUZE/BANDAGES/DRESSINGS) ×6 IMPLANT
BRUSH SCRUB DISP (MISCELLANEOUS) ×3 IMPLANT
COVER SURGICAL LIGHT HANDLE (MISCELLANEOUS) ×3 IMPLANT
DRAPE C-ARM 42X72 X-RAY (DRAPES) ×3 IMPLANT
DRAPE C-ARMOR (DRAPES) ×3 IMPLANT
DRAPE IMP U-DRAPE 54X76 (DRAPES) ×3 IMPLANT
DRAPE INCISE IOBAN 66X45 STRL (DRAPES) IMPLANT
DRAPE ORTHO SPLIT 77X108 STRL (DRAPES) ×4
DRAPE SURG 17X11 SM STRL (DRAPES) ×6 IMPLANT
DRAPE SURG ORHT 6 SPLT 77X108 (DRAPES) ×2 IMPLANT
DRAPE U-SHAPE 47X51 STRL (DRAPES) ×6 IMPLANT
DRILL BIT 2.8MM DB28 (MISCELLANEOUS) ×3 IMPLANT
DRSG ADAPTIC 3X8 NADH LF (GAUZE/BANDAGES/DRESSINGS) ×3 IMPLANT
DRSG MEPILEX BORDER 4X8 (GAUZE/BANDAGES/DRESSINGS) ×3 IMPLANT
DRSG PAD ABDOMINAL 8X10 ST (GAUZE/BANDAGES/DRESSINGS) ×3 IMPLANT
ELECT REM PT RETURN 9FT ADLT (ELECTROSURGICAL) ×3
ELECTRODE REM PT RTRN 9FT ADLT (ELECTROSURGICAL) ×1 IMPLANT
EVACUATOR 1/8 PVC DRAIN (DRAIN) IMPLANT
GAUZE SPONGE 4X4 12PLY STRL (GAUZE/BANDAGES/DRESSINGS) ×6 IMPLANT
GLOVE BIO SURGEON STRL SZ7.5 (GLOVE) ×3 IMPLANT
GLOVE BIO SURGEON STRL SZ8 (GLOVE) ×3 IMPLANT
GLOVE BIOGEL PI IND STRL 7.5 (GLOVE) ×1 IMPLANT
GLOVE BIOGEL PI IND STRL 8 (GLOVE) ×1 IMPLANT
GLOVE BIOGEL PI INDICATOR 7.5 (GLOVE) ×2
GLOVE BIOGEL PI INDICATOR 8 (GLOVE) ×2
GOWN STRL REUS W/ TWL LRG LVL3 (GOWN DISPOSABLE) ×2 IMPLANT
GOWN STRL REUS W/ TWL XL LVL3 (GOWN DISPOSABLE) ×1 IMPLANT
GOWN STRL REUS W/TWL 2XL LVL3 (GOWN DISPOSABLE) ×3 IMPLANT
GOWN STRL REUS W/TWL LRG LVL3 (GOWN DISPOSABLE) ×4
GOWN STRL REUS W/TWL XL LVL3 (GOWN DISPOSABLE) ×2
HOVERMATT SINGLE USE (MISCELLANEOUS) ×3 IMPLANT
K-WIRE 2X5 SS THRDED S3 (WIRE) ×3
KIT BASIN OR (CUSTOM PROCEDURE TRAY) ×3 IMPLANT
KIT ROOM TURNOVER OR (KITS) ×3 IMPLANT
KWIRE 2X5 SS THRDED S3 (WIRE) ×1 IMPLANT
LOOP VESSEL MAXI BLUE (MISCELLANEOUS) IMPLANT
MANIFOLD NEPTUNE II (INSTRUMENTS) ×3 IMPLANT
NS IRRIG 1000ML POUR BTL (IV SOLUTION) ×3 IMPLANT
PACK TOTAL JOINT (CUSTOM PROCEDURE TRAY) ×3 IMPLANT
PACK UNIVERSAL I (CUSTOM PROCEDURE TRAY) ×3 IMPLANT
PAD ARMBOARD 7.5X6 YLW CONV (MISCELLANEOUS) ×6 IMPLANT
PEG STND 4.0X35MM (Orthopedic Implant) ×9 IMPLANT
PEG STND 4.0X42.5MM (Orthopedic Implant) ×3 IMPLANT
PEG STND 4.0X45.0MM (Orthopedic Implant) ×6 IMPLANT
PEGSTD 4.0X35MM (Orthopedic Implant) ×3 IMPLANT
PEGSTD 4.0X42.5MM (Orthopedic Implant) ×1 IMPLANT
PEGSTD 4.0X45.0MM (Orthopedic Implant) ×2 IMPLANT
PLATE SHOULDER S3 11HOLE RT (Plate) ×3 IMPLANT
SCREW LOCK 90D ANGLED 3.8X20 (Screw) ×9 IMPLANT
SCREW LOCK 90D ANGLED 3.8X22 (Screw) ×15 IMPLANT
SCREW LOCK 90D ANGLED 3.8X26 (Screw) ×3 IMPLANT
SCREW MULTIDIR 3.8X22 HUMRL (Screw) ×3 IMPLANT
SCREW MULTIDIR 3.8X34 HUMRL (Screw) ×3 IMPLANT
SLING ARM FOAM STRAP LRG (SOFTGOODS) ×3 IMPLANT
SPONGE LAP 18X18 X RAY DECT (DISPOSABLE) ×3 IMPLANT
STAPLER VISISTAT 35W (STAPLE) ×3 IMPLANT
STOCKINETTE IMPERVIOUS LG (DRAPES) ×3 IMPLANT
SUCTION FRAZIER HANDLE 10FR (MISCELLANEOUS) ×2
SUCTION TUBE FRAZIER 10FR DISP (MISCELLANEOUS) ×1 IMPLANT
SUT ETHIBOND 5 LR DA (SUTURE) ×3 IMPLANT
SUT ETHILON 3 0 PS 1 (SUTURE) ×9 IMPLANT
SUT FIBERWIRE #2 38 T-5 BLUE (SUTURE)
SUT PDS AB 2-0 CT1 27 (SUTURE) IMPLANT
SUT VIC AB 0 CT1 27 (SUTURE) ×4
SUT VIC AB 0 CT1 27XBRD ANBCTR (SUTURE) ×2 IMPLANT
SUT VIC AB 1 CT1 27 (SUTURE) ×4
SUT VIC AB 1 CT1 27XBRD ANBCTR (SUTURE) ×2 IMPLANT
SUT VIC AB 2-0 CT1 27 (SUTURE) ×4
SUT VIC AB 2-0 CT1 TAPERPNT 27 (SUTURE) ×2 IMPLANT
SUT VIC AB 2-0 CT3 27 (SUTURE) IMPLANT
SUTURE FIBERWR #2 38 T-5 BLUE (SUTURE) IMPLANT
SYR 5ML LL (SYRINGE) IMPLANT
TOWEL OR 17X24 6PK STRL BLUE (TOWEL DISPOSABLE) ×3 IMPLANT
TOWEL OR 17X26 10 PK STRL BLUE (TOWEL DISPOSABLE) ×6 IMPLANT
TRAY FOLEY CATH 16FRSI W/METER (SET/KITS/TRAYS/PACK) IMPLANT
WATER STERILE IRR 1000ML POUR (IV SOLUTION) IMPLANT
YANKAUER SUCT BULB TIP NO VENT (SUCTIONS) IMPLANT

## 2016-04-28 NOTE — Transfer of Care (Signed)
Immediate Anesthesia Transfer of Care Note  Patient: Rebecca Hoffman  Procedure(s) Performed: Procedure(s): OPEN REDUCTION INTERNAL FIXATION (ORIF) RIGHT PROXIMAL HUMERUS FRACTURE (Right)  Patient Location: PACU  Anesthesia Type:General  Level of Consciousness: awake  Airway & Oxygen Therapy: Patient Spontanous Breathing and Patient connected to nasal cannula oxygen  Post-op Assessment: Report given to RN  Post vital signs: Reviewed and stable  Last Vitals:  Vitals:   04/28/16 1415 04/28/16 1730  BP:    Pulse: 91   Resp: (!) 24   Temp:  36.8 C    Last Pain:  Vitals:   04/28/16 1020  TempSrc: Oral         Complications: No apparent anesthesia complications

## 2016-04-28 NOTE — Anesthesia Procedure Notes (Signed)
Procedure Name: Intubation Date/Time: 04/28/2016 2:38 PM Performed by: Jefm MilesENNIE, Fleta Borgeson E Pre-anesthesia Checklist: Patient identified, Emergency Drugs available, Suction available and Patient being monitored Patient Re-evaluated:Patient Re-evaluated prior to inductionOxygen Delivery Method: Circle System Utilized Preoxygenation: Pre-oxygenation with 100% oxygen Intubation Type: IV induction Ventilation: Mask ventilation without difficulty Laryngoscope Size: Mac and 3 Grade View: Grade I Tube type: Oral Tube size: 7.0 mm Number of attempts: 1 Airway Equipment and Method: Stylet and Oral airway Placement Confirmation: ETT inserted through vocal cords under direct vision,  positive ETCO2 and breath sounds checked- equal and bilateral Secured at: 21 cm Tube secured with: Tape Dental Injury: Teeth and Oropharynx as per pre-operative assessment

## 2016-04-28 NOTE — Anesthesia Preprocedure Evaluation (Addendum)
Anesthesia Evaluation  Patient identified by MRN, date of birth, ID band Patient awake    Reviewed: Allergy & Precautions, NPO status , Patient's Chart, lab work & pertinent test results  Airway Mallampati: III  TM Distance: <3 FB Neck ROM: Full    Dental  (+) Teeth Intact, Poor Dentition, Chipped, Dental Advisory Given,    Pulmonary neg pulmonary ROS,    breath sounds clear to auscultation       Cardiovascular hypertension,  Rhythm:Regular Rate:Normal     Neuro/Psych negative neurological ROS     GI/Hepatic negative GI ROS, Neg liver ROS,   Endo/Other  Morbid obesity  Renal/GU negative Renal ROS     Musculoskeletal  (+) Arthritis ,   Abdominal   Peds  Hematology negative hematology ROS (+)   Anesthesia Other Findings   Reproductive/Obstetrics                           Lab Results  Component Value Date   WBC 7.6 04/28/2016   HGB 12.2 04/28/2016   HCT 39.1 04/28/2016   MCV 90.7 04/28/2016   PLT 277 04/28/2016   Lab Results  Component Value Date   CREATININE 0.73 04/28/2016   BUN 10 04/28/2016   NA 139 04/28/2016   K 4.1 04/28/2016   CL 106 04/28/2016   CO2 26 04/28/2016    Anesthesia Physical Anesthesia Plan  ASA: III  Anesthesia Plan: General and Regional   Post-op Pain Management:  Regional for Post-op pain   Induction: Intravenous  Airway Management Planned: Oral ETT  Additional Equipment:   Intra-op Plan:   Post-operative Plan: Possible Post-op intubation/ventilation  Informed Consent: I have reviewed the patients History and Physical, chart, labs and discussed the procedure including the risks, benefits and alternatives for the proposed anesthesia with the patient or authorized representative who has indicated his/her understanding and acceptance.   Dental advisory given  Plan Discussed with: CRNA  Anesthesia Plan Comments:         Anesthesia Quick  Evaluation

## 2016-04-28 NOTE — Progress Notes (Signed)
Pt. Admits that she hasn't had any medical care in several days.  Pt. States since she has had the arm injury, she has had homehealth nurse coming in to her   home & she has gotten some help with personal care.  Pt. Reports that she was on BP med. Several yrs. Ago, she went off of the med. Because it caused her BP to go to low.

## 2016-04-28 NOTE — Anesthesia Procedure Notes (Signed)
Anesthesia Regional Block:  Interscalene brachial plexus block  Pre-Anesthetic Checklist: ,, timeout performed, Correct Patient, Correct Site, Correct Laterality, Correct Procedure, Correct Position, site marked, Risks and benefits discussed,  Surgical consent,  Pre-op evaluation,  At surgeon's request and post-op pain management  Laterality: Right  Prep: chloraprep       Needles:  Injection technique: Single-shot  Needle Type: Echogenic Needle     Needle Length: 9cm 9 cm Needle Gauge: 21 and 21 G    Additional Needles:  Procedures: ultrasound guided (picture in chart) and nerve stimulator Interscalene brachial plexus block  Nerve Stimulator or Paresthesia:  Response: deltoid, 0.5 mA,   Additional Responses:   Narrative:  Start time: 04/28/2016 1:56 PM End time: 04/28/2016 2:06 PM Injection made incrementally with aspirations every 5 mL.  Performed by: Personally  Anesthesiologist: Marcene DuosFITZGERALD, Kehinde Totzke

## 2016-04-28 NOTE — Progress Notes (Signed)
Family left belongings here. Called to request they come back and get them. They requested we keep them. Belongings taken to PACU.

## 2016-04-28 NOTE — H&P (Signed)
Orthopaedic Trauma Service    Chief Complaint: R proximal humerus fracture HPI:   75 y/o RHD obese white female sustained ground level fall about 2 1/2 weeks ago. She sustained a R proximal humerus fracture. [facility] and [facility]. She was placed into a coaptation splint there and was seen 2x. Ultimately she was referred to OTS due to the complexity of her fracture. Pt seen in the office on 04/23/2016. We discussed her injury and the fact that it would require surgery to fix. Due to pattern of fracture and body habitus, non-op treatment is highly unlikely to produce a good result. Pt presents today for ORIF of her R proximal humerus. We anticipated overnight stay with dc on POD #1   Past Medical History:  Diagnosis Date  . Anxiety   . Arthritis   . Hypertension    no longer on meds  . Restless legs     Past Surgical History:  Procedure Laterality Date  . NO PAST SURGERIES     No prescriptions prior to admission.     History reviewed. No pertinent family history. Social History:  reports that she has never smoked. She has never used smokeless tobacco. She reports that she does not drink alcohol or use drugs.  Allergies:  Allergies  Allergen Reactions  . Penicillins Other (See Comments)    FLUSHING "made face red"   Labs pending   Review of Systems  Constitutional: Negative for chills and fever.  Respiratory: Negative for shortness of breath and wheezing.   Cardiovascular: Negative for chest pain and palpitations.  Gastrointestinal: Negative for abdominal pain, nausea and vomiting.  Musculoskeletal:       Right arm pain   Neurological: Negative for tingling, sensory change and headaches.    Vitals on arrival to short stay   Physical Exam  Constitutional:  Morbidly obese white female. Very pleasant, NAD   Cardiovascular: S1 normal and S2 normal.   Respiratory: Effort normal and breath sounds  normal. She has no wheezes. She has no rales.  Musculoskeletal:  Right upper extremity     Extensive ecchymosis to upper arm     Elbow, forearm, wrist nontender    Swelling to upper arm noted    Distally swelling is well controlled    + radial pulse       Brisk cap refill     R/U/M sensation intact    R/U/M/AIN/PIN motor intact         Assessment/Plan  75 y/o female with R proximal humeral shaft fracture  OR for ORIF R humerus Can WB thru arm after surgery  No active abduction x 6-8 weeks but unrestricted motion otherwise Unrestricted ROM R elbow, forearm, wrist and hand  Admit for observation, pain control and therapies post op Likely dc home on POD 1  Risks and benefits reviewed, pt wishes to proceed She is at risk for periop complications given body habitus   Taiyana Kissler W, PA-C 04/28/2016, 8:58 AM

## 2016-04-29 ENCOUNTER — Encounter (HOSPITAL_COMMUNITY): Payer: Self-pay | Admitting: Orthopedic Surgery

## 2016-04-29 DIAGNOSIS — E559 Vitamin D deficiency, unspecified: Secondary | ICD-10-CM

## 2016-04-29 DIAGNOSIS — S42201A Unspecified fracture of upper end of right humerus, initial encounter for closed fracture: Secondary | ICD-10-CM | POA: Diagnosis not present

## 2016-04-29 DIAGNOSIS — F419 Anxiety disorder, unspecified: Secondary | ICD-10-CM | POA: Diagnosis present

## 2016-04-29 DIAGNOSIS — N390 Urinary tract infection, site not specified: Secondary | ICD-10-CM

## 2016-04-29 DIAGNOSIS — I10 Essential (primary) hypertension: Secondary | ICD-10-CM | POA: Diagnosis present

## 2016-04-29 HISTORY — DX: Urinary tract infection, site not specified: N39.0

## 2016-04-29 HISTORY — DX: Vitamin D deficiency, unspecified: E55.9

## 2016-04-29 LAB — CBC
HEMATOCRIT: 36.9 % (ref 36.0–46.0)
Hemoglobin: 11.7 g/dL — ABNORMAL LOW (ref 12.0–15.0)
MCH: 28.7 pg (ref 26.0–34.0)
MCHC: 31.7 g/dL (ref 30.0–36.0)
MCV: 90.7 fL (ref 78.0–100.0)
Platelets: 216 10*3/uL (ref 150–400)
RBC: 4.07 MIL/uL (ref 3.87–5.11)
RDW: 15.1 % (ref 11.5–15.5)
WBC: 9.8 10*3/uL (ref 4.0–10.5)

## 2016-04-29 LAB — BASIC METABOLIC PANEL
ANION GAP: 9 (ref 5–15)
BUN: 9 mg/dL (ref 6–20)
CALCIUM: 8.3 mg/dL — AB (ref 8.9–10.3)
CO2: 28 mmol/L (ref 22–32)
CREATININE: 0.79 mg/dL (ref 0.44–1.00)
Chloride: 103 mmol/L (ref 101–111)
GFR calc Af Amer: >60 mL/min (ref >60)
GFR calc non Af Amer: >60 mL/min (ref >60)
GLUCOSE: 122 mg/dL — AB (ref 65–99)
Potassium: 4.4 mmol/L (ref 3.5–5.1)
Sodium: 140 mmol/L (ref 135–145)

## 2016-04-29 LAB — CALCIUM, IONIZED: CALCIUM, IONIZED, SERUM: 4.6 mg/dL (ref 4.5–5.6)

## 2016-04-29 LAB — CALCITRIOL (1,25 DI-OH VIT D): Vit D, 1,25-Dihydroxy: 47.1 pg/mL (ref 19.9–79.3)

## 2016-04-29 LAB — VITAMIN D 25 HYDROXY (VIT D DEFICIENCY, FRACTURES): VIT D 25 HYDROXY: 6.3 ng/mL — AB (ref 30.0–100.0)

## 2016-04-29 MED ORDER — ACETAMINOPHEN 500 MG PO TABS: 500.0000 mg | ORAL_TABLET | Freq: Four times a day (QID) | ORAL | 0 refills | Status: AC | PRN

## 2016-04-29 MED ORDER — VITAMIN D 1000 UNITS PO TABS
2000.0000 [IU] | ORAL_TABLET | Freq: Every day | ORAL | Status: DC
  Administered 2016-04-29 – 2016-04-30 (×2): 2000 [IU] via ORAL
  Filled 2016-04-29 (×3): qty 2

## 2016-04-29 MED ORDER — ACETAMINOPHEN 500 MG PO TABS: 500.0000 mg | ORAL_TABLET | Freq: Four times a day (QID) | ORAL | Status: DC | PRN

## 2016-04-29 MED ORDER — CIPROFLOXACIN HCL 500 MG PO TABS
500.0000 mg | ORAL_TABLET | Freq: Two times a day (BID) | ORAL | Status: DC
Start: 2016-04-29 — End: 2016-04-30
  Administered 2016-04-29 (×2): 500 mg via ORAL
  Filled 2016-04-29 (×2): qty 1

## 2016-04-29 MED ORDER — VITAMIN D (ERGOCALCIFEROL) 1.25 MG (50000 UNIT) PO CAPS
50000.0000 [IU] | ORAL_CAPSULE | ORAL | Status: DC
  Administered 2016-04-29: 50000 [IU] via ORAL
  Filled 2016-04-29: qty 1

## 2016-04-29 MED ORDER — CALCIUM CITRATE 950 (200 CA) MG PO TABS
200.0000 mg | ORAL_TABLET | Freq: Two times a day (BID) | ORAL | Status: DC
  Administered 2016-04-29 – 2016-04-30 (×3): 200 mg via ORAL
  Filled 2016-04-29 (×4): qty 1

## 2016-04-29 MED ORDER — OXYCODONE HCL 5 MG PO TABS: 5.0000 mg | ORAL_TABLET | Freq: Four times a day (QID) | ORAL | 0 refills | Status: DC | PRN

## 2016-04-29 MED ORDER — ACETAMINOPHEN 650 MG RE SUPP: 650.0000 mg | Freq: Four times a day (QID) | RECTAL | Status: DC | PRN

## 2016-04-29 MED ORDER — VITAMIN C 500 MG PO TABS
500.0000 mg | ORAL_TABLET | Freq: Every day | ORAL | Status: DC
  Administered 2016-04-29 – 2016-04-30 (×2): 500 mg via ORAL
  Filled 2016-04-29 (×2): qty 1

## 2016-04-29 NOTE — Evaluation (Signed)
Physical Therapy Evaluation Patient Details Name: Rebecca Hoffman MRN: [id number] DOB: 08/12/1940 Today's Date: 04/29/2016   History of Present Illness   75 y/o RHD obese white female sustained ground level fall about 2 1/2 weeks ago. Had been managing in a splint until consult with Ortho Trauma, who advised ORIF; now s/p ORIF;  has a past medical history of Anxiety; Arthritis; Hypertension; and Restless legs.  Clinical Impression   Patient is s/p above surgery resulting in functional limitations due to the deficits listed below (see PT Problem List). Noted significant hypoxia with ambulation/activity on Room Air; RN notified; Will consider using a cane next few sessions to see if that helps Ms. Ringler with her fear of falling; Patient will benefit from skilled PT to increase their independence and safety with mobility to allow discharge to the venue listed below.    See also OT consult and O2 qualifying notes     Follow Up Recommendations SNF    Equipment Recommendations  Other (comment) (Oxygen; will consider cane)    Recommendations for Other Services Other (comment) (SW)     Precautions / Restrictions Precautions Precautions: Fall Precaution Comments: RUE ROM restrictions as ordered; watch O2 sats; on 10/17 desatted to 83% on Room Air Required Braces or Orthoses: Sling Restrictions Weight Bearing Restrictions: Yes RUE Weight Bearing: Non weight bearing      Mobility  Bed Mobility Overal bed mobility: Needs Assistance Bed Mobility: Supine to Sit     Supine to sit: Min guard     General bed mobility comments: Used L rail; cues for technique and to avoid using RUE; Good rise to sit; cues for reciprocal scooting; requested assistance, but able to scoot to EOB with cues, use of rail  Transfers Overall transfer level: Needs assistance   Transfers: Sit to/from Stand Sit to Stand: Min guard         General transfer comment: Stood from bed with L UE push; Cues mostly for  line management and control with sit <>stand to toilet in bathroom  Ambulation/Gait Ambulation/Gait assistance: Min guard Ambulation Distance (Feet): 85 Feet Assistive device: None (but close guard) Gait Pattern/deviations: Step-through pattern;Wide base of support     General Gait Details: Walks quickly and needed cues to slow down, breathe; no gross loss of balance; became quite anxious with incr distance walked, when asked about walking further she indicated she is fearful of falling, noted HR incr to 124. cues to slow walk and slow breathing  Stairs            Wheelchair Mobility    Modified Rankin (Stroke Patients Only)       Balance                                             Pertinent Vitals/Pain Pain Assessment: 0-10 Pain Score: 8  Pain Location: R UE Pain Descriptors / Indicators: Aching;Grimacing;Discomfort Pain Intervention(s): Premedicated before session;Repositioned    Home Living Family/patient expects to be discharged to:: Private residence Living Arrangements: Children Available Help at Discharge: Family;Other (Comment) (son with recent foot fx) Type of Home:  (2 Level Condo) Home Access: Stairs to enter;Level entry     Home Layout: Two level Home Equipment: Shower seat      Prior Function Level of Independence: Independent   Gait / Transfers Assistance Needed: no AD for mobility  ADL's / [employer]  Needed: occasional assist with doning shoes        Hand Dominance   Dominant Hand: Right    Extremity/Trunk Assessment   Upper Extremity Assessment: Defer to OT evaluation           Lower Extremity Assessment: Overall WFL for tasks assessed (some hip range restrictions due to body habitus)         Communication   Communication: No difficulties  Cognition Arousal/Alertness: Awake/alert Behavior During Therapy: WFL for tasks assessed/performed (though became anxious with incr distance walked) Overall  Cognitive Status: Within Functional Limits for tasks assessed                      General Comments General comments (skin integrity, edema, etc.): See other PT note of this date for O2 sats during amb    Exercises     Assessment/Plan    PT Assessment Patient needs continued PT services  PT Problem List Decreased range of motion;Decreased activity tolerance;Decreased balance;Decreased mobility;Decreased coordination;Decreased knowledge of use of DME;Decreased safety awareness;Decreased knowledge of precautions;Pain;Obesity;Cardiopulmonary status limiting activity          PT Treatment Interventions DME instruction;Gait training;Stair training;Functional mobility training;Therapeutic activities;Therapeutic exercise;Patient/family education    PT Goals (Current goals can be found in the Care Plan section)  Acute Rehab PT Goals Patient Stated Goal: did not state PT Goal Formulation: With patient Time For Goal Achievement: 05/13/16 Potential to Achieve Goals: Good    Frequency Min 3X/week   Barriers to discharge Inaccessible home environment;Decreased caregiver support Son who gives assist recently broke his foot; Flight of steps to access second floor    Co-evaluation PT/OT/SLP Co-Evaluation/Treatment: Yes Reason for Co-Treatment: For patient/therapist safety PT goals addressed during session: Mobility/safety with mobility         End of Session Equipment Utilized During Treatment: Oxygen (sling) Activity Tolerance: Patient tolerated treatment well (though desat with activity as documented) Patient left: in chair;with call bell/phone within reach Nurse Communication: Mobility status (O2 sats)    Functional Assessment Tool Used: Clinical Judgement Functional Limitation: Mobility: Walking and moving around Mobility: Walking and Moving Around Current Status (E4540(G8978): At least 1 percent but less than 20 percent impaired, limited or restricted Mobility: Walking and  Moving Around Goal Status [phone](G8979): 0 percent impaired, limited or restricted    Time: 0940-1016 PT Time Calculation (min) (ACUTE ONLY): 36 min   Charges:   PT Evaluation $PT Eval Moderate Complexity: 1 Procedure     PT G Codes:   PT G-Codes **NOT FOR INPATIENT CLASS** Functional Assessment Tool Used: Clinical Judgement Functional Limitation: Mobility: Walking and moving around Mobility: Walking and Moving Around Current Status (J4782(G8978): At least 1 percent but less than 20 percent impaired, limited or restricted Mobility: Walking and Moving Around Goal Status [phone](G8979): 0 percent impaired, limited or restricted    Van ClinesGarrigan, Sonnet Rizor Hamff 04/29/2016, 11:07 AM   Van ClinesHolly Maxmillian Carsey, PT  Acute Rehabilitation Services Pager [phone] Office [phone]

## 2016-04-29 NOTE — Progress Notes (Addendum)
Pt has bed tomorrow for [facility] for 10/18  Auth #:[id number]#:[id number], [id number]  Burna SisJenna H. Wajiha Versteeg, LCSW Clinical Social Worker [phone]

## 2016-04-29 NOTE — [place] FL2 (Signed)
[place] MEDICAID FL2 LEVEL OF CARE SCREENING TOOL     IDENTIFICATION  Patient Name: Rebecca Hoffman Birthdate: March 19, 1941 Sex: female Admission Date (Current Location): 04/28/2016  [facility] [place]   Facility and Address:  [facility] [address], [place], [place] [address]      Provider Number: [id number]  Attending Physician Name and Address:  Myrene GalasMichael Handy, MD  Relative Name and Phone Number:       Current Level of Care: Hospital Recommended Level of Care: Skilled Nursing Facility Prior Approval Number:    Date Approved/Denied:   PASRR Number: [id number]  Discharge Plan: SNF    Current Diagnoses: Patient Active Problem List   Diagnosis Date Noted  . UTI (urinary tract infection) 04/29/2016  . Hypertension   . Morbid obesity (HCC)   . Anxiety   . Closed displaced comminuted fracture of shaft of right humerus 04/28/2016    Orientation RESPIRATION BLADDER Height & Weight     Self, Time, Situation, Place  O2 (2L [place]) Continent Weight: 270 lb (122.5 kg) Height:  5\' 2"  (157.5 cm)  BEHAVIORAL SYMPTOMS/MOOD NEUROLOGICAL BOWEL NUTRITION STATUS      Continent Diet (see DC summary)  AMBULATORY STATUS COMMUNICATION OF NEEDS Skin   Limited Assist Verbally Surgical wounds                       Personal Care Assistance Level of Assistance  Bathing, Dressing Bathing Assistance: Limited assistance   Dressing Assistance: Limited assistance     Functional Limitations Info             SPECIAL CARE FACTORS FREQUENCY  PT (By licensed PT), OT (By licensed OT)     PT Frequency: 5/wk OT Frequency: 5/wk            Contractures      Additional Factors Info  Code Status, Allergies Code Status Info: FULL Allergies Info: Penicillins           Current Medications (04/29/2016):  This is the current hospital active medication list Current Facility-Administered Medications  Medication Dose Route Frequency  Provider Last Rate Last Dose  . 0.9 % NaCl with KCl 20 mEq/ L  infusion   Intravenous Continuous Montez MoritaKeith Paul, PA-C 50 mL/hr at 04/29/16 0023    . acetaminophen (TYLENOL) tablet 650 mg  650 mg Oral Q6H PRN Montez MoritaKeith Paul, PA-C       Or  . acetaminophen (TYLENOL) suppository 650 mg  650 mg Rectal Q6H PRN Montez MoritaKeith Paul, PA-C      . alum & mag hydroxide-simeth (MAALOX/MYLANTA) 200-200-20 MG/5ML suspension 30 mL  30 mL Oral Q4H PRN Montez MoritaKeith Paul, PA-C      . calcium citrate (CALCITRATE - dosed in mg elemental calcium) tablet 200 mg of elemental calcium  200 mg of elemental calcium Oral BID Montez MoritaKeith Paul, PA-C      . cholecalciferol (VITAMIN D) tablet 2,000 Units  2,000 Units Oral Daily Montez MoritaKeith Paul, PA-C      . ciprofloxacin (CIPRO) tablet 500 mg  500 mg Oral BID Montez MoritaKeith Paul, PA-C      . clindamycin (CLEOCIN) IVPB 600 mg  600 mg Intravenous Q6H Montez MoritaKeith Paul, PA-C   600 mg at 04/29/16 0543  . docusate sodium (COLACE) capsule 100 mg  100 mg Oral BID Montez MoritaKeith Paul, PA-C   100 mg at 04/29/16 [id number]  . enoxaparin (LOVENOX) injection 40 mg  40 mg Subcutaneous Q24H Montez MoritaKeith Paul, PA-C  40 mg at 04/29/16 0909  . menthol-cetylpyridinium (CEPACOL) lozenge 3 mg  1 lozenge Oral PRN Montez Morita, PA-C       Or  . phenol (CHLORASEPTIC) mouth spray 1 spray  1 spray Mouth/Throat PRN Montez Morita, PA-C      . metoCLOPramide (REGLAN) tablet 5-10 mg  5-10 mg Oral Q8H PRN Montez Morita, PA-C       Or  . metoCLOPramide (REGLAN) injection 5-10 mg  5-10 mg Intravenous Q8H PRN Montez Morita, PA-C      . morphine 2 MG/ML injection 1-2 mg  1-2 mg Intravenous Q3H PRN Montez Morita, PA-C      . ondansetron [facility]) tablet 4 mg  4 mg Oral Q6H PRN Montez Morita, PA-C       Or  . ondansetron [facility]) injection 4 mg  4 mg Intravenous Q6H PRN Montez Morita, PA-C      . oxyCODONE (Oxy IR/ROXICODONE) immediate release tablet 5-10 mg  5-10 mg Oral Q3H PRN Montez Morita, PA-C      . oxyCODONE-acetaminophen (PERCOCET/ROXICET) 5-325 MG per tablet 1-2 tablet  1-2 tablet Oral Q6H PRN  Montez Morita, PA-C   1 tablet at 04/29/16 0909  . vitamin C (ASCORBIC ACID) tablet 500 mg  500 mg Oral Daily Montez Morita, PA-C      . Vitamin D (Ergocalciferol) (DRISDOL) capsule 50,000 Units  50,000 Units Oral Q7 days Montez Morita, PA-C         Discharge Medications: Please see discharge summary for a list of discharge medications.  Relevant Imaging Results:  Relevant Lab Results:   Additional Information SS#: [id number]  Burna Sis, LCSW

## 2016-04-29 NOTE — Clinical Social Work Note (Signed)
Clinical Social Work Assessment  Patient Details  Name: Rebecca Hoffman MRN: [id number] Date of Birth: 11/27/1940  Date of referral:  04/29/16               Reason for consult:  Facility Placement                Permission sought to share information with:  Oceanographeracility Contact Representative Permission granted to share information::  Yes, Verbal Permission Granted  Name::     Chief of StaffMonte  Agency::  SNFs  Relationship::  son  Contact Information:     Housing/Transportation Living arrangements for the past 2 months:  Single Family Home Source of Information:  Patient Patient Interpreter Needed:  None Criminal Activity/Legal Involvement Pertinent to Current Situation/Hospitalization:  No - Comment as needed Significant Relationships:  Adult Children Lives with:  Adult Children Do you feel safe going back to the place where you live?  No Need for family participation in patient care:  Yes (Comment) (son usually helps with some caregiving at home)  Care giving concerns:  Pt lives at home with son who was helping with mobility while she recovered from surgery/fracture- son now with foot fracture and unable to provide as much help at home.   Social Worker assessment / plan:  CSW spoke with pt concerning PT recommendation for SNF. Pt states she has never been to SNF before- CSW explained SNF referral process and discussed SNF set up with patient.  Employment status:  Retired Database administratornsurance information:  Managed Medicare PT Recommendations:  Skilled Nursing Facility Information / Referral to community resources:  Skilled Nursing Facility  Patient/Family's Response to care:  Pt acknowledges that she will be unable to care for herself at home and cannot get along without help from her son.  Patient/Family's Understanding of and Emotional Response to Diagnosis, Current Treatment, and Prognosis:  Pt is hopeful her arm will recover soon and she will be able to return home but is realistic about her recovery  time/needs.  Emotional Assessment Appearance:  Appears stated age Attitude/Demeanor/Rapport:    Affect (typically observed):  Appropriate, Accepting Orientation:  Oriented to Self, Oriented to Place, Oriented to  Time, Oriented to Situation Alcohol / Substance use:  Not Applicable Psych involvement (Current and /or in the community):  No (Comment)  Discharge Needs  Concerns to be addressed:  Care Coordination Readmission within the last 30 days:  Yes Current discharge risk:  Physical Impairment Barriers to Discharge:  Continued Medical Work up   Burna SisUris, Miguelangel Korn H, LCSW 04/29/2016, 3:46 PM

## 2016-04-29 NOTE — Care Management Note (Signed)
Case Management Note  Patient Details  Name: Rebecca Hoffman MRN: [id number] Date of Birth: Jul 06, 1941  Subjective/Objective:                 Patient from home, lives with son who is her caretaker, he recently fell and broke his leg and is unable to care or her. Patient has arm Fx with scheduled surgical repair. PT rec SNF. CSw consulted to assess if patient's insurance will approve SNF at DC. CM awaiting to hear back from CSW/ insurance.    Action/Plan:   Expected Discharge Date:                  Expected Discharge Plan:  Skilled Nursing Facility  In-House Referral:  Clinical Social Work  Discharge planning Services  CM Consult  Post Acute Care Choice:    Choice offered to:     DME Arranged:    DME Agency:     HH Arranged:    HH Agency:     Status of Service:  In process, will continue to follow  If discussed at Long Length of Stay Meetings, dates discussed:    Additional Comments:  Lawerance SabalDebbie Cashawn Yanko, RN 04/29/2016, 1:38 PM

## 2016-04-29 NOTE — Discharge Instructions (Signed)
Orthopaedic Trauma Service Discharge Instructions   General Discharge Instructions  WEIGHT BEARING STATUS: Nonweightbearing Right upper extremity   RANGE OF MOTION/ACTIVITY: No active abduction Right shoulder. Passive and active shoulder flexion and extension ok. Very gentle IR/ER. Unrestricted ROM R elbow, forearm, wrist and hand. No lifting with R arm   Sling on when mobilizing and for comfort If wearing sling, pt needs to come out of sling periodically to move elbow, forearm, wrist and hand   Wound Care:      Daily wound care starting on 06/01/2016. See below  Discharge Wound Care Instructions  Do NOT apply any ointments, solutions or lotions to pin sites or surgical wounds.  These prevent needed drainage and even though solutions like hydrogen peroxide kill bacteria, they also damage cells lining the pin sites that help fight infection.  Applying lotions or ointments can keep the wounds moist and can cause them to breakdown and open up as well. This can increase the risk for infection. When in doubt call the office.  Surgical incisions should be dressed daily.  If any drainage is noted, use one layer of adaptic, then gauze, Kerlix, and an ace wrap.  Once the incision is completely dry and without drainage, it may be left open to air out.  Showering may begin 36-48 hours later.  Cleaning gently with soap and water.  Traumatic wounds should be dressed daily as well.    One layer of adaptic, gauze, Kerlix, then ace wrap.  The adaptic can be discontinued once the draining has ceased    If you have a wet to dry dressing: wet the gauze with saline the squeeze as much saline out so the gauze is moist (not soaking wet), place moistened gauze over wound, then place a dry gauze over the moist one, followed by Kerlix wrap, then ace wrap.   PAIN MEDICATION USE AND EXPECTATIONS  You have likely been given narcotic medications to help control your pain.  After a traumatic event that results in  an fracture (broken bone) with or without surgery, it is ok to use narcotic pain medications to help control one's pain.  We understand that everyone responds to pain differently and each individual patient will be evaluated on a regular basis for the continued need for narcotic medications. Ideally, narcotic medication use should last no more than 6-8 weeks (coinciding with fracture healing).   As a patient it is your responsibility as well to monitor narcotic medication use and report the amount and frequency you use these medications when you come to your office visit.   We would also advise that if you are using narcotic medications, you should take a dose prior to therapy to maximize you participation.  IF YOU ARE ON NARCOTIC MEDICATIONS IT IS NOT PERMISSIBLE TO OPERATE A MOTOR VEHICLE (MOTORCYCLE/CAR/TRUCK/MOPED) OR HEAVY MACHINERY DO NOT MIX NARCOTICS WITH OTHER CNS (CENTRAL NERVOUS SYSTEM) DEPRESSANTS SUCH AS ALCOHOL  Diet: as you were eating previously.  Can use over the counter stool softeners and bowel preparations, such as Miralax, to help with bowel movements.  Narcotics can be constipating.  Be sure to drink plenty of fluids    STOP SMOKING OR USING NICOTINE PRODUCTS!!!!  As discussed nicotine severely impairs your body's ability to heal surgical and traumatic wounds but also impairs bone healing.  Wounds and bone heal by forming microscopic blood vessels (angiogenesis) and nicotine is a vasoconstrictor (essentially, shrinks blood vessels).  Therefore, if vasoconstriction occurs to these microscopic blood vessels they essentially  disappear and are unable to deliver necessary nutrients to the healing tissue.  This is one modifiable factor that you can do to dramatically increase your chances of healing your injury.    (This means no smoking, no nicotine gum, patches, etc)  DO NOT USE NONSTEROIDAL ANTI-INFLAMMATORY DRUGS (NSAID'S)  Using products such as Advil (ibuprofen), Aleve  (naproxen), Motrin (ibuprofen) for additional pain control during fracture healing can delay and/or prevent the healing response.  If you would like to take over the counter (OTC) medication, Tylenol (acetaminophen) is ok.  However, some narcotic medications that are given for pain control contain acetaminophen as well. Therefore, you should not exceed more than 4000 mg of tylenol in a day if you do not have liver disease.  Also note that there are may OTC medicines, such as cold medicines and allergy medicines that my contain tylenol as well.  If you have any questions about medications and/or interactions please ask your doctor/PA or your pharmacist.      ICE AND ELEVATE INJURED/OPERATIVE EXTREMITY  Using ice and elevating the injured extremity above your heart can help with swelling and pain control.  Icing in a pulsatile fashion, such as 20 minutes on and 20 minutes off, can be followed.    Do not place ice directly on skin. Make sure there is a barrier between to skin and the ice pack.    Using frozen items such as frozen peas works well as the conform nicely to the are that needs to be iced.  USE AN ACE WRAP OR TED HOSE FOR SWELLING CONTROL  In addition to icing and elevation, Ace wraps or TED hose are used to help limit and resolve swelling.  It is recommended to use Ace wraps or TED hose until you are informed to stop.    When using Ace Wraps start the wrapping distally (farthest away from the body) and wrap proximally (closer to the body)   Example: If you had surgery on your leg or thing and you do not have a splint on, start the ace wrap at the toes and work your way up to the thigh        If you had surgery on your upper extremity and do not have a splint on, start the ace wrap at your fingers and work your way up to the upper arm  IF YOU ARE IN A SPLINT OR CAST DO NOT REMOVE IT FOR ANY REASON   If your splint gets wet for any reason please contact the office immediately. You may shower in  your splint or cast as long as you keep it dry.  This can be done by wrapping in a cast cover or garbage back (or similar)  Do Not stick any thing down your splint or cast such as pencils, money, or hangers to try and scratch yourself with.  If you feel itchy take benadryl as prescribed on the bottle for itching  IF YOU ARE IN A CAM BOOT (BLACK BOOT)  You may remove boot periodically. Perform daily dressing changes as noted below.  Wash the liner of the boot regularly and wear a sock when wearing the boot. It is recommended that you sleep in the boot until told otherwise  CALL THE OFFICE WITH ANY QUESTIONS OR CONCERNS: [phone]

## 2016-04-29 NOTE — Progress Notes (Signed)
[facility]thopaedic Trauma [facility] Progress Note  Subjective  Doing ok today Reviewed PT note regarding O2 sats during therapy  Pt does report occasional dyspnea when walking up flight of stairs but recovers quickly   No cp or SOB currently Most recent 02 sats are 99% on 2L  pts son who would be primary caregiver sustained an ankle fracture over the weekend and will be unable to care for his mom. She will need SNF  We did discuss that her fracture looked to be more than 9 weeks old as there was extensive callus present Pt states that she falls somewhat frequently   ROS As above  Objective   BP (!) 158/63 (BP Location: Left Wrist)   Pulse 97   Temp 98.3 F (36.8 C) (Oral)   Resp 17   Ht '5\' 2"'$  (1.575 m)   Wt 122.5 kg (270 lb)   SpO2 99%   BMI 49.38 kg/m   Intake/Output      10/16 0701 - 10/17 0700 10/17 0701 - 10/18 0700   I.V. (mL/kg) 1230.8 (10)    IV Piggyback 50    Total Intake(mL/kg) 1280.8 (10.5)    Urine (mL/kg/hr) 450 200 (0.2)   Blood 250    Total Output 700 200   Net +580.8 -200          Labs  Results for SHANTORIA, ELLWOOD (MRN [id number]) as of 04/29/2016 15:59  Ref. Range 04/28/2016 11:10  Vit D, 1,25-Dihydroxy Latest Ref Range: 19.9 - 79.3 pg/mL 47.1  Vitamin D, 25-Hydroxy Latest Ref Range: 30.0 - 100.0 ng/mL 6.3 (L)  Results for MAGUIRE, KILLMER (MRN [id number]) as of 04/29/2016 15:59  Ref. Range 04/29/2016 07:59  Sodium Latest Ref Range: 135 - 145 mmol/L 140  Potassium Latest Ref Range: 3.5 - 5.1 mmol/L 4.4  Chloride Latest Ref Range: 101 - 111 mmol/L 103  CO2 Latest Ref Range: 22 - 32 mmol/L 28  BUN Latest Ref Range: 6 - 20 mg/dL 9  Creatinine Latest Ref Range: 0.44 - 1.00 mg/dL 0.79  Calcium Latest Ref Range: 8.9 - 10.3 mg/dL 8.3 (L)  EGFR (Non-African Amer.) Latest Ref Range: >60 mL/min >60  EGFR (African American) Latest Ref Range: >60 mL/min >60  Glucose Latest Ref Range: 65 - 99 mg/dL 122 (H)  Anion gap Latest Ref Range: 5 - 15  9   Results for Fluke,  Mayli (MRN [id number]) as of 04/29/2016 15:59  Ref. Range 04/29/2016 07:59  WBC Latest Ref Range: 4.0 - 10.5 K/uL 9.8  RBC Latest Ref Range: 3.87 - 5.11 MIL/uL 4.07  Hemoglobin Latest Ref Range: 12.0 - 15.0 g/dL 11.7 (L)  HCT Latest Ref Range: 36.0 - 46.0 % 36.9  MCV Latest Ref Range: 78.0 - 100.0 fL 90.7  MCH Latest Ref Range: 26.0 - 34.0 pg 28.7  MCHC Latest Ref Range: 30.0 - 36.0 g/dL 31.7  RDW Latest Ref Range: 11.5 - 15.5 % 15.1  Platelets Latest Ref Range: 150 - 400 K/uL 216   Exam  Gen: awake and alert, NAD, comfortable Lungs: breathing unlabored  Cardiac: reg  Ext:       Right Upper Extremity   Dressing stable and intact, scant drainage  Swelling stable  Extensive ecchymosis to R arm   R/U/M motor and sensory functions intact  Ext warm   + radial pulse     Assessment and Plan   POD/HD#: 1  75 y/o white female with subacute R humerus fx  -R humerus fracture/nonunion   No active abduction  R shoulder  Active and passive shoulder flexion and extension  Gentle IR/ER  Unrestricted elbow, forearm, wrist and finger motion   Ice prn   Dressing changes as needed     - Pain management:  Tylenol first   Oxy IR for uncontrolled pain   - ABL anemia/Hemodynamics  CBC in am   - Medical issues   Hypoxia   Possibly some baseline sleep apnea/obstructive pattern due to body habitus   Currently stable   Exacerbation could be related to anesthesia   Aggressive IS   Minimize narcs   Sit upright as much as possible   Wean supplemental O2 for sats >92%   Medicine consult if worsens   Not feel additional w/u necessary at the moment but low threshold    HTN   Monitor BPs      UTI   U/a suggestive of UTI   Cx shows >10^5 E. Coli    cipro 500 mg po bid x 7 days    - DVT/PE prophylaxis:  Lovenox while inpt  - ID:   periop abx  - Metabolic Bone Disease:  Vitamin D deficiency    Supplement   Recommend DEXA as outpt   Bone quality is poor   - Activity:  Up  with PT   Sounds as if pt may need walker at baseline if she is falling frequently   May benefit from program such as balance for life   - FEN/GI prophylaxis/Foley/Lines:  Diet as tolerated  - Dispo:  Dc to SNF tomorrow as long as pt remains stable     Jari Pigg, PA-C [facility] [phone] (O) 04/29/2016 3:56 PM

## 2016-04-29 NOTE — Progress Notes (Signed)
SATURATION QUALIFICATIONS: (This note is used to comply with regulatory documentation for home oxygen)  Patient Saturations on Room Air at Rest = 88-90%  Patient Saturations on Room Air while Ambulating = 83%  Patient Saturations on 2 Liters of oxygen while Ambulating = 93%  Please briefly explain why patient needs home oxygen: Patient requires supplemental oxygen to maintain oxygen saturations at acceptable, safe levels with physical activity.   Please see also OT and PT evals of this date

## 2016-04-29 NOTE — Progress Notes (Signed)
Physical Therapy Note  SATURATION QUALIFICATIONS: (This note is used to comply with regulatory documentation for home oxygen)  Patient Saturations on Room Air at Rest = 88-90%  Patient Saturations on Room Air while Ambulating = 83%  Patient Saturations on 2 Liters of oxygen while Ambulating = 93%  Please briefly explain why patient needs home oxygen: Patient requires supplemental oxygen to maintain oxygen saturations at acceptable, safe levels with physical activity.   Please see also OT and PT evals of this date;   Van ClinesHolly Belita Warsame, [place] CarolinaPT  Acute Rehabilitation Services Pager [phone] Office [phone]

## 2016-04-29 NOTE — Anesthesia Postprocedure Evaluation (Signed)
Anesthesia Post Note  Patient: Milderd MeagerDoris Parish  Procedure(s) Performed: Procedure(s) (LRB): OPEN REDUCTION INTERNAL FIXATION (ORIF) RIGHT PROXIMAL HUMERUS FRACTURE (Right)  Patient location during evaluation: PACU Anesthesia Type: General and Regional Level of consciousness: awake and alert Pain management: pain level controlled Vital Signs Assessment: post-procedure vital signs reviewed and stable Respiratory status: spontaneous breathing, nonlabored ventilation, respiratory function stable and patient connected to nasal cannula oxygen Cardiovascular status: blood pressure returned to baseline and stable Postop Assessment: no signs of nausea or vomiting Anesthetic complications: no    Last Vitals:  Vitals:   04/28/16 2242 04/29/16 0610  BP: 139/68 (!) 158/63  Pulse: 88 97  Resp: 18 17  Temp: 36.7 C 36.8 C    Last Pain:  Vitals:   04/29/16 0610  TempSrc: Osvaldo Angstral                 Kennieth RadFitzgerald, Aaima Gaddie E

## 2016-04-29 NOTE — Evaluation (Signed)
Occupational Therapy Evaluation Patient Details Name: Rebecca Hoffman MRN: [id number] DOB: 03/17/1941 Today's Date: 04/29/2016    History of Present Illness  75 y/o RHD obese white female sustained ground level fall about 2 1/2 weeks ago. Had been managing in a splint until consult with Ortho Trauma, who advised ORIF; now s/p ORIF;  has a past medical history of Anxiety; Arthritis; Hypertension; and Restless legs.   Clinical Impression   Pt with decline in function and safety with ADLs and ADL mobility. Pt with decreases strength, endurance, R UE ROM/function with NWB restrictions. Pt's has decreased O2 SATs on RA and with exertion. Pt would benefit from acute OT services to address impairments to increase level of function and safety    Follow Up Recommendations  SNF    Equipment Recommendations  Other (comment) (TBD)    Recommendations for Other Services       Precautions / Restrictions Precautions Precautions: Shoulder;Fall Type of Shoulder Precautions: FF 0-90 degrees, NO active ABD, gentle Passive ABD, Gentle IR and ER. AROM of hand, wrist and elbow as instructed per MD Shoulder Interventions: Shoulder sling/immobilizer;For comfort (for sleep) Precaution Booklet Issued: Yes (comment) Precaution Comments: RUE ROM restrictions as ordered; watch O2 sats; on 10/17 desatted to 83% on Room Air. SlingFor comfort and sleep Required Braces or Orthoses: Sling Restrictions Weight Bearing Restrictions: Yes RUE Weight Bearing: Non weight bearing      Mobility Bed Mobility Overal bed mobility: Needs Assistance Bed Mobility: Supine to Sit     Supine to sit: Min guard     General bed mobility comments: Used L rail; cues for technique and to avoid using RUE; Good rise to sit; cues for reciprocal scooting; requested assistance, but able to scoot to EOB with cues, use of rail  Transfers Overall transfer level: Needs assistance Equipment used: None Transfers: Sit to/from Stand Sit  to Stand: Min guard         General transfer comment: Stood from bed with L UE push; Cues mostly for line management and control with sit <>stand to toilet in bathroom    Balance Overall balance assessment: No apparent balance deficits (not formally assessed)                                          ADL Overall ADL's : Needs assistance/impaired Eating/Feeding: Set up   Grooming: Standing;Min guard   Upper Body Bathing: Moderate assistance   Lower Body Bathing: Maximal assistance   Upper Body Dressing : Moderate assistance   Lower Body Dressing: Maximal assistance;Total assistance   Toilet Transfer: Data processing manager and Hygiene: Moderate assistance   Tub/ Shower Transfer: 3 in 1;Min guard   Functional mobility during ADLs: Min guard       Vision  no change from baseline              Pertinent Vitals/Pain Pain Assessment: 0-10 Pain Score: 8  Pain Location: R UE Pain Descriptors / Indicators: Aching;Grimacing;Discomfort Pain Intervention(s): Monitored during session;Repositioned;Premedicated before session     Hand Dominance Right   Extremity/Trunk Assessment Upper Extremity Assessment Upper Extremity Assessment: Generalized weakness;RUE deficits/detail RUE Deficits / Details: NWB. FF 0-90 degrees, NO active ABD, passive ABD ok, GENTLE IR and ER. AROM of wrist, hand, elbow ok RUE: Unable to fully assess due to immobilization   Lower Extremity Assessment Lower Extremity Assessment: Defer  to PT evaluation   Cervical / Trunk Assessment Cervical / Trunk Assessment: Normal   Communication Communication Communication: No difficulties   Cognition Arousal/Alertness: Awake/alert Behavior During Therapy: WFL for tasks assessed/performed Overall Cognitive Status: Within Functional Limits for tasks assessed                     General Comments   pt very pleasant and cooperative     Exercises  FF R shoulder 0-90 degrees, Passive ABD, gentle ER and IR as instructed per MD. AROM R hand.wrist and elbow     Shoulder Instructions Shoulder Instructions Donning/doffing shirt without moving shoulder: Patient able to independently direct caregiver;Minimal assistance Method for sponge bathing under operated UE: Patient able to independently direct caregiver;Minimal assistance Donning/doffing sling/immobilizer: Patient able to independently direct caregiver Correct positioning of sling/immobilizer: Patient able to independently direct caregiver;Minimal assistance ROM for elbow, wrist and digits of operated UE: Supervision/safety Sling wearing schedule (on at all times/off for ADL's): Supervision/safety (for sleep and comfort) Proper positioning of operated UE when showering: Supervision/safety Positioning of UE while sleeping: Supervision/safety    Home Living Family/patient expects to be discharged to:: Private residence Living Arrangements: Children Available Help at Discharge: Family;Other (Comment) (son with recent foot fx?) Type of Home: Other(Comment) (2 level condo) Home Access: Stairs to enter;Level entry     Home Layout: Two level Alternate Level Stairs-Number of Steps: 17 Alternate Level Stairs-Rails: Right Bathroom Shower/Tub: Chief Strategy Officer: Handicapped height     Home Equipment: Shower seat          Prior Functioning/Environment Level of Independence: Paramedic / Transfers Assistance Needed: no AD for mobility ADL's / Homemaking Assistance Needed: occasional assist with doning shoes            OT Problem List: Decreased strength;Impaired balance (sitting and/or standing);Decreased range of motion;Impaired UE functional use;Decreased activity tolerance;Decreased knowledge of use of DME or AE;Decreased knowledge of precautions;Pain;Obesity;Cardiopulmonary status limiting activity   OT Treatment/Interventions:  Self-care/ADL training;DME and/or AE instruction;Therapeutic activities;Energy conservation;Patient/family education;Therapeutic exercise    OT Goals(Current goals can be found in the care plan section) Acute Rehab OT Goals Patient Stated Goal: did not state OT Goal Formulation: With patient Time For Goal Achievement: 05/06/16 Potential to Achieve Goals: Good ADL Goals Pt Will Perform Grooming: with supervision;with set-up;standing Pt Will Perform Upper Body Bathing: with min assist Pt Will Perform Lower Body Bathing: with mod assist;sitting/lateral leans;sit to/from stand Pt Will Perform Upper Body Dressing: with min assist Pt Will Transfer to Toilet: with supervision;bedside commode;ambulating Pt Will Perform Toileting - Clothing Manipulation and hygiene: with min assist;sit to/from stand;sitting/lateral leans Additional ADL Goal #1: Pt will recall all R UE restrictions Additional ADL Goal #2: Pt will perform R UE ROM exercises allowed by MD as instructed by OT  OT Frequency: Min 2X/week   Barriers to D/C: Decreased caregiver support          Co-evaluation PT/OT/SLP Co-Evaluation/Treatment: Yes Reason for Co-Treatment: For patient/therapist safety PT goals addressed during session: Mobility/safety with mobility OT goals addressed during session: ADL's and self-care;Strengthening/ROM      End of Session Equipment Utilized During Treatment: Other (comment);Oxygen (O2, sling)  Activity Tolerance: Patient tolerated treatment well Patient left: in chair;with call bell/phone within reach   Time: 1610-9604 OT Time Calculation (min): 36 min Charges:  OT General Charges $OT Visit: 1 Procedure OT Evaluation $OT Eval Moderate Complexity: 1 Procedure OT Treatments $Therapeutic Activity: 8-22 mins G-Codes: OT G-codes **NOT  FOR INPATIENT CLASS** Functional Assessment Tool Used: clinical judgement Functional Limitation: Self care Self Care Current Status ([id number]): At least 40 percent  but less than 60 percent impaired, limited or restricted Self Care Goal Status (U0454(G8988): At least 20 percent but less than 40 percent impaired, limited or restricted  Galen ManilaSpencer, Kellen Hover Jeanette 04/29/2016, 1:56 PM

## 2016-04-30 ENCOUNTER — Encounter (HOSPITAL_COMMUNITY): Payer: Self-pay | Admitting: Orthopedic Surgery

## 2016-04-30 DIAGNOSIS — S42353K Displaced comminuted fracture of shaft of humerus, unspecified arm, subsequent encounter for fracture with nonunion: Secondary | ICD-10-CM

## 2016-04-30 DIAGNOSIS — S42201A Unspecified fracture of upper end of right humerus, initial encounter for closed fracture: Secondary | ICD-10-CM | POA: Diagnosis not present

## 2016-04-30 HISTORY — DX: Displaced comminuted fracture of shaft of humerus, unspecified arm, subsequent encounter for fracture with nonunion: S42.353K

## 2016-04-30 LAB — BASIC METABOLIC PANEL
Anion gap: 8 (ref 5–15)
BUN: 11 mg/dL (ref 6–20)
CALCIUM: 8.5 mg/dL — AB (ref 8.9–10.3)
CO2: 26 mmol/L (ref 22–32)
CREATININE: 0.78 mg/dL (ref 0.44–1.00)
Chloride: 103 mmol/L (ref 101–111)
GFR calc non Af Amer: >60 mL/min (ref >60)
Glucose, Bld: 123 mg/dL — ABNORMAL HIGH (ref 65–99)
Potassium: 4.4 mmol/L (ref 3.5–5.1)
SODIUM: 137 mmol/L (ref 135–145)

## 2016-04-30 LAB — URINE CULTURE

## 2016-04-30 LAB — CBC
HEMATOCRIT: 34.3 % — AB (ref 36.0–46.0)
Hemoglobin: 10.7 g/dL — ABNORMAL LOW (ref 12.0–15.0)
MCH: 28 pg (ref 26.0–34.0)
MCHC: 31.2 g/dL (ref 30.0–36.0)
MCV: 89.8 fL (ref 78.0–100.0)
Platelets: 243 10*3/uL (ref 150–400)
RBC: 3.82 MIL/uL — ABNORMAL LOW (ref 3.87–5.11)
RDW: 15.1 % (ref 11.5–15.5)
WBC: 10.1 10*3/uL (ref 4.0–10.5)

## 2016-04-30 MED ORDER — CALCIUM CITRATE 950 (200 CA) MG PO TABS: 200.0000 mg | ORAL_TABLET | Freq: Two times a day (BID) | ORAL | Status: DC

## 2016-04-30 MED ORDER — ASCORBIC ACID 500 MG PO TABS: 500.0000 mg | ORAL_TABLET | Freq: Every day | ORAL | Status: DC

## 2016-04-30 MED ORDER — DOCUSATE SODIUM 100 MG PO CAPS: 100.0000 mg | ORAL_CAPSULE | Freq: Two times a day (BID) | ORAL | 0 refills | Status: DC

## 2016-04-30 MED ORDER — CIPROFLOXACIN HCL 500 MG PO TABS: 500.0000 mg | ORAL_TABLET | Freq: Two times a day (BID) | ORAL | Status: DC

## 2016-04-30 MED ORDER — CHOLECALCIFEROL 50 MCG (2000 UT) PO TABS: 2000.0000 [IU] | ORAL_TABLET | Freq: Every day | ORAL | Status: DC

## 2016-04-30 MED ORDER — VITAMIN D (ERGOCALCIFEROL) 1.25 MG (50000 UNIT) PO CAPS: 50000.0000 [IU] | ORAL_CAPSULE | ORAL | Status: DC

## 2016-04-30 NOTE — Progress Notes (Signed)
Patient will DC to: [facility] Anticipated DC date: 04/30/16 Family notified: Patient alerting family Transport by: Sharin MonsPTAR   Per MD patient ready for DC to [facility]. RN, patient, patient's family, and facility notified of DC. Discharge Summary sent to facility. RN given number for report. DC packet on chart. Ambulance transport requested for patient.   CSW signing off.  Cristobal GoldmannNadia Eliezer Khawaja, [place] Clinical Social Worker [phone]

## 2016-04-30 NOTE — Discharge Summary (Signed)
[facility]thopaedic Trauma Service (OTS)  Patient ID: Rebecca Hoffman MRN: [id number] DOB/AGE: [birth year] 75 y.o.  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 Marcelino Scot  ORIF R humeral shaft nonunion   Discharged Condition: stable  Hospital Course:   75 y/o morbidly obese white female, RHD, admitted on 04/28/2016. Pt was taken to the OR for the procedure noted above. Her hospital stay was notable for some desaturation on POD 1 while working with therapy. Pt responded favorably to 2L supplemental O2. There were no other clinical findings to support an acute pathologic process. Pt participated with therapy and progressed well. Pt does require assistance due to balance issues. Pt unable to care for herself and her son, who is primary caregiver has sustained a lower extremity injury and is unable to provide assistance.  It is medically necessary for the safety of the pt to dc to SNF  On admission basic lab testing was notable for UTI. Pt was place on cipro 500 mg PO BID x 7 days. She was also noted to be vitamin D deficient and as such was started on supplementation   Pt has been voiding w/o difficulty and tolerating a diet   She is discharged to SNF on POD 2 in stable condition   Consults: None  Significant Diagnostic Studies: labs:   Results for ELVIE, PALOMO (MRN [id number]) as of 04/30/2016 13:28  Ref. Range 04/30/2016 07:47  Sodium Latest Ref Range: 135 - 145 mmol/L 137  Potassium Latest Ref Range: 3.5 - 5.1 mmol/L 4.4  Chloride Latest Ref Range: 101 - 111 mmol/L 103  CO2 Latest  Ref Range: 22 - 32 mmol/L 26  BUN Latest Ref Range: 6 - 20 mg/dL 11  Creatinine Latest Ref Range: 0.44 - 1.00 mg/dL 0.78  Calcium Latest Ref Range: 8.9 - 10.3 mg/dL 8.5 (L)  EGFR (Non-African Amer.) Latest Ref Range: >60 mL/min >60  EGFR (African American) Latest Ref Range: >60 mL/min >60  Glucose Latest Ref Range: 65 - 99 mg/dL 123 (H)  Anion gap Latest Ref Range: 5 - 15  8  Results for RONIYAH, LLORENS (MRN [id number]) as of 04/30/2016 13:28  Ref. Range 04/28/2016 11:10  Calcium, Ionized, Serum Latest Ref Range: 4.5 - 5.6 mg/dL 4.6  Phosphorus Latest Ref Range: 2.5 - 4.6 mg/dL 2.9  Magnesium Latest Ref Range: 1.7 - 2.4 mg/dL 2.0  Alkaline Phosphatase Latest Ref Range: 38 - 126 U/L 161 (H)  Albumin Latest Ref Range: 3.5 - 5.0 g/dL 3.4 (L)  AST Latest Ref Range: 15 - 41 U/L 20  ALT Latest Ref Range: 14 - 54 U/L 26  Total Protein Latest Ref Range: 6.5 - 8.1 g/dL 7.0  Total Bilirubin Latest Ref Range: 0.3 - 1.2 mg/dL 0.7  Results for SHANTARA, GOOSBY (MRN [id number]) as of 04/30/2016 13:28  Ref. Range 04/28/2016 11:10  Vit D, 1,25-Dihydroxy Latest Ref Range: 19.9 - 79.3 pg/mL 47.1  Vitamin D, 25-Hydroxy Latest Ref Range: 30.0 - 100.0 ng/mL 6.3 (L)  Results for TORINA, EY (MRN [id number]) as of 04/30/2016 13:28  Ref. Range 04/30/2016 07:47  WBC Latest Ref Range: 4.0 -  10.5 K/uL 10.1  RBC Latest Ref Range: 3.87 - 5.11 MIL/uL 3.82 (L)  Hemoglobin Latest Ref Range: 12.0 - 15.0 g/dL 63.1 (L)  HCT Latest Ref Range: 36.0 - 46.0 % 34.3 (L)  MCV Latest Ref Range: 78.0 - 100.0 fL 89.8  MCH Latest Ref Range: 26.0 - 34.0 pg 28.0  MCHC Latest Ref Range: 30.0 - 36.0 g/dL 49.7  RDW Latest Ref Range: 11.5 - 15.5 % 15.1  Platelets Latest Ref Range: 150 - 400 K/uL 243   Status:  Final result   Visible to patient:  No (Not Released) Next appt:  None    2d ago   Specimen Description  URINE, CLEAN CATCH   Special Requests NONE   Culture  >=100,000 COLONIES/mL ESCHERICHIA COLI    Report Status  04/30/2016  FINAL   Organism ID, Bacteria  ESCHERICHIA COLI    Resulting Agency [facility]  Susceptibility   Escherichia coli    MIC    AMPICILLIN >=32 RESIST... Resistant    AMPICILLIN/SULBACTAM 16 INTERMED... Intermediate    CEFAZOLIN <=4 SENSITIVE  Sensitive    CEFTRIAXONE <=1 SENSITIVE  Sensitive    CIPROFLOXACIN <=0.25 SENS... Sensitive    Extended ESBL NEGATIVE  Sensitive    GENTAMICIN >=16 RESIST... Resistant    IMIPENEM <=0.25 SENS... Sensitive    NITROFURANTOIN <=16 SENSIT... Sensitive    PIP/TAZO <=4 SENSITIVE  Sensitive    TRIMETH/SULFA <=20 SENSIT... Sensitive          Results for MALAJAH, OCEGUERA (MRN [id number]) as of 04/30/2016 13:28  Ref. Range 04/28/2016 11:05  Appearance Latest Ref Range: CLEAR  CLOUDY (A)  Bacteria, UA Latest Ref Range: NONE SEEN  MANY (A)  Bilirubin Urine Latest Ref Range: NEGATIVE  NEGATIVE  Color, Urine Latest Ref Range: YELLOW  AMBER (A)  Glucose Latest Ref Range: NEGATIVE mg/dL NEGATIVE  Hgb urine dipstick Latest Ref Range: NEGATIVE  NEGATIVE  Ketones, ur Latest Ref Range: NEGATIVE mg/dL NEGATIVE  Leukocytes, UA Latest Ref Range: NEGATIVE  NEGATIVE  Nitrite Latest Ref Range: NEGATIVE  POSITIVE (A)  pH Latest Ref Range: 5.0 - 8.0  7.0  Protein Latest Ref Range: NEGATIVE mg/dL NEGATIVE  RBC / HPF Latest Ref Range: 0 - 5 RBC/hpf 0-5  Specific Gravity, Urine Latest Ref Range: 1.005 - 1.030  1.018  Squamous Epithelial / LPF Latest Ref Range: NONE SEEN  6-30 (A)  WBC, UA Latest Ref Range: 0 - 5 WBC/hpf 0-5   Treatments: IV hydration, antibiotics: Cipro and clindamycin, analgesia: tylenol, oxy IR and percocet, anticoagulation: LMW heparin, therapies: PT, OT, RN and SW and surgery: as above  Discharge Exam:     Orthopaedic Trauma Service Progress Note   Subjective   Doing ok today Reviewed PT note regarding O2 sats during therapy  Pt does report occasional dyspnea when walking up flight of stairs but recovers quickly    No cp or SOB currently Most  recent 02 sats are 99% on 2L   pts son who would be primary caregiver sustained an ankle fracture over the weekend and will be unable to care for his mom. She will need SNF   We did discuss that her fracture looked to be more than 28 weeks old as there was extensive callus present Pt states that she falls somewhat frequently    ROS As above   Objective    BP (!) 158/63 (BP Location: Left Wrist)   Pulse 97   Temp 98.3 F (36.8 C) (Oral)   Resp 17  Ht '5\' 2"'$  (1.575 m)   Wt 122.5 kg (270 lb)   SpO2 99%   BMI 49.38 kg/m    Intake/Output      10/16 0701 - 10/17 0700 10/17 0701 - 10/18 0700   I.V. (mL/kg) 1230.8 (10)    IV Piggyback 50    Total Intake(mL/kg) 1280.8 (10.5)    Urine (mL/kg/hr) 450 200 (0.2)   Blood 250    Total Output 700 200   Net +580.8 -200           Labs   Results for SHAQUILLA, KEHRES (MRN [id number]) as of 04/29/2016 15:59   Ref. Range 04/28/2016 11:10  Vit D, 1,25-Dihydroxy Latest Ref Range: 19.9 - 79.3 pg/mL 47.1  Vitamin D, 25-Hydroxy Latest Ref Range: 30.0 - 100.0 ng/mL 6.3 (L)  Results for TWALA, COLLINGS (MRN [id number]) as of 04/29/2016 15:59   Ref. Range 04/29/2016 07:59  Sodium Latest Ref Range: 135 - 145 mmol/L 140  Potassium Latest Ref Range: 3.5 - 5.1 mmol/L 4.4  Chloride Latest Ref Range: 101 - 111 mmol/L 103  CO2 Latest Ref Range: 22 - 32 mmol/L 28  BUN Latest Ref Range: 6 - 20 mg/dL 9  Creatinine Latest Ref Range: 0.44 - 1.00 mg/dL 0.79  Calcium Latest Ref Range: 8.9 - 10.3 mg/dL 8.3 (L)  EGFR (Non-African Amer.) Latest Ref Range: >60 mL/min >60  EGFR (African American) Latest Ref Range: >60 mL/min >60  Glucose Latest Ref Range: 65 - 99 mg/dL 122 (H)  Anion gap Latest Ref Range: 5 - 15  9    Results for Biggers, Doshia (MRN [id number]) as of 04/29/2016 15:59   Ref. Range 04/29/2016 07:59  WBC Latest Ref Range: 4.0 - 10.5 K/uL 9.8  RBC Latest Ref Range: 3.87 - 5.11 MIL/uL 4.07  Hemoglobin Latest Ref Range: 12.0 - 15.0 g/dL 11.7 (L)  HCT Latest  Ref Range: 36.0 - 46.0 % 36.9  MCV Latest Ref Range: 78.0 - 100.0 fL 90.7  MCH Latest Ref Range: 26.0 - 34.0 pg 28.7  MCHC Latest Ref Range: 30.0 - 36.0 g/dL 31.7  RDW Latest Ref Range: 11.5 - 15.5 % 15.1  Platelets Latest Ref Range: 150 - 400 K/uL 216    Exam   Gen: awake and alert, NAD, comfortable Lungs: breathing unlabored  Cardiac: reg  Ext:       Right Upper Extremity              Dressing stable and intact, scant drainage             Swelling stable             Extensive ecchymosis to R arm              R/U/M motor and sensory functions intact             Ext warm              + radial pulse                 Assessment and Plan     75 y/o white female with subacute R humerus fx   -R humerus fracture/nonunion              No active abduction R shoulder             Active and passive shoulder flexion and extension             Gentle IR/ER  Unrestricted elbow, forearm, wrist and finger motion              Ice prn              Dressing changes as needed                 - Pain management:             Tylenol first              Oxy IR for uncontrolled pain    - ABL anemia/Hemodynamics             stable  - Medical issues              Hypoxia                         Possibly some baseline sleep apnea/obstructive pattern due to body habitus                         Currently stable                         Exacerbation could be related to anesthesia                         Aggressive IS                         Minimize narcs                         Sit upright as much as possible                         Wean supplemental O2 for sats >92%                                   HTN                         Monitor BPs                                        UTI                         U/a suggestive of UTI                         Cx shows >10^5 E. Coli                          cipro 500 mg po bid x 7 days               - DVT/PE prophylaxis:              Lovenox while inpt   - ID:              periop abx   - Metabolic Bone Disease:             Vitamin D deficiency  Supplement                         Recommend DEXA as outpt                         Bone quality is poor    - Activity:             Up with PT              Sounds as if pt may need walker at baseline if she is falling frequently              May benefit from program such as balance for life    - FEN/GI prophylaxis/Foley/Lines:             Diet as tolerated   - Dispo:             Dc to SNF        Disposition: 01-Home or Self Care  Discharge Instructions    Call MD / Call 911    Complete by:  As directed    If you experience chest pain or shortness of breath, CALL 911 and be transported to the hospital emergency room.  If you develope a fever above 101 F, pus (white drainage) or increased drainage or redness at the wound, or calf pain, call your surgeon's office.   Constipation Prevention    Complete by:  As directed    Drink plenty of fluids.  Prune juice may be helpful.  You may use a stool softener, such as Colace (over the counter) 100 mg twice a day.  Use MiraLax (over the counter) for constipation as needed.   Diet - low sodium heart healthy    Complete by:  As directed    Discharge instructions    Complete by:  As directed    Orthopaedic Trauma Service Discharge Instructions   General Discharge Instructions  WEIGHT BEARING STATUS: Nonweightbearing Right upper extremity   RANGE OF MOTION/ACTIVITY: No active abduction Right shoulder. Passive and active shoulder flexion and extension ok. Very gentle IR/ER. Unrestricted ROM R elbow, forearm, wrist and hand. No lifting with R arm   Sling on when mobilizing and for comfort If wearing sling, pt needs to come out of sling periodically to move elbow, forearm, wrist and hand   Wound Care:      Daily wound care starting on 06/01/2016. See below  Discharge Wound Care Instructions  Do  NOT apply any ointments, solutions or lotions to pin sites or surgical wounds.  These prevent needed drainage and even though solutions like hydrogen peroxide kill bacteria, they also damage cells lining the pin sites that help fight infection.  Applying lotions or ointments can keep the wounds moist and can cause them to breakdown and open up as well. This can increase the risk for infection. When in doubt call the office.  Surgical incisions should be dressed daily.  If any drainage is noted, use one layer of adaptic, then gauze, Kerlix, and an ace wrap.  Once the incision is completely dry and without drainage, it may be left open to air out.  Showering may begin 36-48 hours later.  Cleaning gently with soap and water.  Traumatic wounds should be dressed daily as well.    One layer of adaptic, gauze, Kerlix, then ace wrap.  The adaptic can be discontinued once the draining has ceased  If you have a wet to dry dressing: wet the gauze with saline the squeeze as much saline out so the gauze is moist (not soaking wet), place moistened gauze over wound, then place a dry gauze over the moist one, followed by Kerlix wrap, then ace wrap.   PAIN MEDICATION USE AND EXPECTATIONS  You have likely been given narcotic medications to help control your pain.  After a traumatic event that results in an fracture (broken bone) with or without surgery, it is ok to use narcotic pain medications to help control one's pain.  We understand that everyone responds to pain differently and each individual patient will be evaluated on a regular basis for the continued need for narcotic medications. Ideally, narcotic medication use should last no more than 6-8 weeks (coinciding with fracture healing).   As a patient it is your responsibility as well to monitor narcotic medication use and report the amount and frequency you use these medications when you come to your office visit.   We would also advise that if you are using  narcotic medications, you should take a dose prior to therapy to maximize you participation.  IF YOU ARE ON NARCOTIC MEDICATIONS IT IS NOT PERMISSIBLE TO OPERATE A MOTOR VEHICLE (MOTORCYCLE/CAR/TRUCK/MOPED) OR HEAVY MACHINERY DO NOT MIX NARCOTICS WITH OTHER CNS (CENTRAL NERVOUS SYSTEM) DEPRESSANTS SUCH AS ALCOHOL  Diet: as you were eating previously.  Can use over the counter stool softeners and bowel preparations, such as Miralax, to help with bowel movements.  Narcotics can be constipating.  Be sure to drink plenty of fluids    STOP SMOKING OR USING NICOTINE PRODUCTS!!!!  As discussed nicotine severely impairs your body's ability to heal surgical and traumatic wounds but also impairs bone healing.  Wounds and bone heal by forming microscopic blood vessels (angiogenesis) and nicotine is a vasoconstrictor (essentially, shrinks blood vessels).  Therefore, if vasoconstriction occurs to these microscopic blood vessels they essentially disappear and are unable to deliver necessary nutrients to the healing tissue.  This is one modifiable factor that you can do to dramatically increase your chances of healing your injury.    (This means no smoking, no nicotine gum, patches, etc)  DO NOT USE NONSTEROIDAL ANTI-INFLAMMATORY DRUGS (NSAID'S)  Using products such as Advil (ibuprofen), Aleve (naproxen), Motrin (ibuprofen) for additional pain control during fracture healing can delay and/or prevent the healing response.  If you would like to take over the counter (OTC) medication, Tylenol (acetaminophen) is ok.  However, some narcotic medications that are given for pain control contain acetaminophen as well. Therefore, you should not exceed more than 4000 mg of tylenol in a day if you do not have liver disease.  Also note that there are may OTC medicines, such as cold medicines and allergy medicines that my contain tylenol as well.  If you have any questions about medications and/or interactions please ask your  doctor/PA or your pharmacist.      ICE AND ELEVATE INJURED/OPERATIVE EXTREMITY  Using ice and elevating the injured extremity above your heart can help with swelling and pain control.  Icing in a pulsatile fashion, such as 20 minutes on and 20 minutes off, can be followed.    Do not place ice directly on skin. Make sure there is a barrier between to skin and the ice pack.    Using frozen items such as frozen peas works well as the conform nicely to the are that needs to be iced.  USE AN ACE WRAP OR TED  HOSE FOR SWELLING CONTROL  In addition to icing and elevation, Ace wraps or TED hose are used to help limit and resolve swelling.  It is recommended to use Ace wraps or TED hose until you are informed to stop.    When using Ace Wraps start the wrapping distally (farthest away from the body) and wrap proximally (closer to the body)   Example: If you had surgery on your leg or thing and you do not have a splint on, start the ace wrap at the toes and work your way up to the thigh        If you had surgery on your upper extremity and do not have a splint on, start the ace wrap at your fingers and work your way up to the upper arm  IF YOU ARE IN A SPLINT OR CAST DO NOT REMOVE IT FOR ANY REASON   If your splint gets wet for any reason please contact the office immediately. You may shower in your splint or cast as long as you keep it dry.  This can be done by wrapping in a cast cover or garbage back (or similar)  Do Not stick any thing down your splint or cast such as pencils, money, or hangers to try and scratch yourself with.  If you feel itchy take benadryl as prescribed on the bottle for itching  IF YOU ARE IN A CAM BOOT (BLACK BOOT)  You may remove boot periodically. Perform daily dressing changes as noted below.  Wash the liner of the boot regularly and wear a sock when wearing the boot. It is recommended that you sleep in the boot until told otherwise  CALL THE OFFICE WITH ANY QUESTIONS OR  CONCERNS: [phone]   Increase activity slowly as tolerated    Complete by:  As directed    Lifting restrictions    Complete by:  As directed    No lifting       Medication List    STOP taking these medications   oxycodone 5 MG capsule Commonly known as:  OXY-IR Replaced by:  oxyCODONE 5 MG immediate release tablet     TAKE these medications   acetaminophen 500 MG tablet Commonly known as:  TYLENOL Take 1-2 tablets (500-1,000 mg total) by mouth every 6 (six) hours as needed for mild pain (or Fever >/= 101).   ascorbic acid 500 MG tablet Commonly known as:  VITAMIN C Take 1 tablet (500 mg total) by mouth daily. Start taking on:  05/01/2016   calcium citrate 950 MG tablet Commonly known as:  CALCITRATE - dosed in mg elemental calcium Take 1 tablet (200 mg of elemental calcium total) by mouth 2 (two) times daily.   Cholecalciferol 2000 units Tabs Take 1 tablet (2,000 Units total) by mouth daily. Start taking on:  05/01/2016   ciprofloxacin 500 MG tablet Commonly known as:  CIPRO Take 1 tablet (500 mg total) by mouth 2 (two) times daily.   docusate sodium 100 MG capsule Commonly known as:  COLACE Take 1 capsule (100 mg total) by mouth 2 (two) times daily.   oxyCODONE 5 MG immediate release tablet Commonly known as:  Oxy IR/ROXICODONE Take 1-2 tablets (5-10 mg total) by mouth every 6 (six) hours as needed for severe pain or breakthrough pain. Replaces:  oxycodone 5 MG capsule   Vitamin D (Ergocalciferol) 50000 units Caps capsule Commonly known as:  DRISDOL Take 1 capsule (50,000 Units total) by mouth every 7 (seven) days. Start taking  on:  05/06/2016      Follow-up Information    HANDY,MICHAEL H, MD. Schedule an appointment as soon as possible for a visit in 10 day(s).   Specialty:  Orthopedic Surgery Contact information: [place] [address] [phone]           Discharge Instructions and Plan:   75 y/o white female  with subacute R humerus fx   -R humerus fracture/nonunion              No active abduction R shoulder             Active and passive shoulder flexion and extension             Gentle IR/ER             Unrestricted elbow, forearm, wrist and finger motion              Ice prn              Dressing changes as needed                 - Pain management:             Tylenol first              Oxy IR for uncontrolled pain    - ABL anemia/Hemodynamics             stable    - Medical issues              Hypoxia                         Possibly some baseline sleep apnea/obstructive pattern due to body habitus                         Currently stable                         Exacerbation could be related to anesthesia                         Aggressive IS                         Minimize narcs                         Sit upright as much as possible                         Wean supplemental O2 for sats >92%                                    HTN                         Monitor BPs                                        UTI                         U/a suggestive  of UTI                         Cx shows >10^5 E. Coli                          cipro 500 mg po bid x 7 days               - DVT/PE prophylaxis:             Lovenox while inpt   - ID:              periop abx   - Metabolic Bone Disease:             Vitamin D deficiency                          Supplement                         Recommend DEXA as outpt                         Bone quality is poor    - Activity:             Up with PT              Sounds as if pt may need walker at baseline if she is falling frequently              May benefit from program such as balance for life    - FEN/GI prophylaxis/Foley/Lines:             Diet as tolerated   - Dispo:             Dc to SNF   F/u with ortho in 10 days    Signed:  Jari Pigg, PA-C [facility] [phone] (P) 04/30/2016, 1:52 PM

## 2016-04-30 NOTE — Clinical Social Work Placement (Signed)
   CLINICAL SOCIAL WORK PLACEMENT  NOTE  Date:  04/30/2016  Patient Details  Name: Rebecca Hoffman MRN: [id number] Date of Birth: Dec 30, 1940  Clinical Social Work is seeking post-discharge placement for this patient at the Skilled  Nursing Facility level of care (*CSW will initial, date and re-position this form in  chart as items are completed):  Yes   Patient/family provided with [facility] Clinical Social Work Department's list of facilities offering this level of care within the geographic area requested by the patient (or if unable, by the patient's family).  Yes   Patient/family informed of their freedom to choose among providers that offer the needed level of care, that participate in Medicare, Medicaid or managed care program needed by the patient, have an available bed and are willing to accept the patient.  Yes   Patient/family informed of [facility] ownership interest [facility] and [facility], as well as of the fact that they are under no obligation to receive care at these facilities.  PASRR submitted to EDS on 04/29/16     PASRR number received on 04/29/16     Existing PASRR number confirmed on       FL2 transmitted to all facilities in geographic area requested by pt/family on 04/29/16     FL2 transmitted to all facilities within larger geographic area on       Patient informed that his/her managed care company has contracts with or will negotiate with certain facilities, including the following:        Yes   Patient/family informed of bed offers received.  Patient [facility]     Physician recommends and patient chooses bed at      Patient to be transferred to [facility], [place] on 04/30/16.  Patient to be transferred to facility by [facility]     Patient family notified on 04/30/16 of transfer.  Name of family member notified:  Patient alerting family     PHYSICIAN Please sign FL2     Additional Comment:     _______________________________________________ Mearl LatinNadia S Insiya Oshea, LCSWA 04/30/2016, 4:06 PM

## 2016-04-30 NOTE — Progress Notes (Signed)
[facility]uma [facility] (OTS)  Subjective: 2 Days Post-Op Procedure(s) (LRB): OPEN REDUCTION INTERNAL FIXATION (ORIF) RIGHT PROXIMAL HUMERUS FRACTURE (Right) Patient reports pain as moderate.    Objective: Current Vitals Blood pressure (!) 145/66, pulse 91, temperature 98.3 F (36.8 C), temperature source Oral, resp. rate 18, height 5\' 2"  (1.575 m), weight 122.5 kg (270 lb), SpO2 96 %. Vital signs in last 24 hours: Temp:  [98 F (36.7 C)-98.3 F (36.8 C)] 98.3 F (36.8 C) (10/18 0526) Pulse Rate:  [91-95] 91 (10/18 0526) Resp:  [18] 18 (10/18 0526) BP: (139-145)/(66-78) 145/66 (10/18 0526) SpO2:  [95 %-96 %] 96 % (10/18 0526)  Intake/Output from previous day: 10/17 0701 - 10/18 0700 In: -  Out: 200 [Urine:200]  LABS  Recent Labs  04/28/16 1110 04/29/16 0759  HGB 12.2 11.7*    Recent Labs  04/28/16 1110 04/29/16 0759  WBC 7.6 9.8  RBC 4.31 4.07  HCT 39.1 36.9  PLT 277 216    Recent Labs  04/29/16 0759 04/30/16 0747  NA 140 137  K 4.4 4.4  CL 103 103  CO2 28 26  BUN 9 11  CREATININE 0.79 0.78  GLUCOSE 122* 123*  CALCIUM 8.3* 8.5*    Recent Labs  04/28/16 1110  INR 1.05   Physical Exam  Breathing well LUEx   Dressing in place  Sens  Ax/R/M/U intact  Mot   Ax/ R/ PIN/ M/ AIN/ U intact  Brisk CR  Assessment/Plan: 2 Days Post-Op Procedure(s) (LRB): OPEN REDUCTION INTERNAL FIXATION (ORIF) RIGHT PROXIMAL HUMERUS FRACTURE (Right) D/c planning OT  Myrene GalasMichael Mehmet Scally, MD [facility] [phone] (p)  04/30/2016, 8:50 AM

## 2016-05-12 NOTE — Op Note (Signed)
NAMMilderd Hoffman:  Rebecca Hoffman                  ACCOUNT NO.:  [id number]  MEDICAL RECORD NO.:  [id number]  LOCATION:  [id number]                        FACILITY:  [facility]  PHYSICIAN:  Rebecca Hoffman, M.D. DATE OF BIRTH:  1941-06-13  DATE OF PROCEDURE:  04/28/2016 DATE OF DISCHARGE:  04/30/2016                              OPERATIVE REPORT   PREOPERATIVE DIAGNOSIS:  Right humeral shaft fracture.  POSTOPERATIVE DIAGNOSIS:  Right humeral shaft fracture.  PROCEDURE:  Open reduction and internal fixation of right proximal humerus and shaft fractures.  SURGEON:  Rebecca Hoffman, M.D.  ASSISTANT:  Rebecca MoritaKeith Paul, PA-C.  ANESTHESIA:  General.  COMPLICATIONS:  None.  DISPOSITION:  To PACU.  CONDITION:  Stable.  BRIEF SUMMARY OF INDICATIONS FOR PROCEDURE:  Rebecca Hoffman is a morbidly obese female who sustained a right humerus fracture involving the proximal humerus as well as the shaft.  She was initially seen and evaluated at an outside facility.  An attempt at closed reduction and closed treatment was made, but the patient's body habitus precluded maintenance of reduction and furthermore the fracture pattern is well known for persistent varus and a high nonunion risk.  Followup films demonstrated increasing displacement and unacceptable varus malalignment.  I did discuss with the patient and her son the risks and benefits of surgical repair including the potential for nerve injury, vessel injury, DVT, PE, malunion, nonunion, need for further surgery, and multiple others.  After full discussion, they did wish to proceed.  BRIEF SUMMARY OF PROCEDURE:  The patient was taken to the operating room where general anesthesia was induced.  Her right upper extremity was prepped and draped in usual sterile fashion.  I did have to use a different set up because of the patient's weight, which added operative time.  Also, the dissection and exposure were lengthened because of her size and weight, and I did require  additional assistant.  As I continued dissection down, brought the arm into abduction, and assistant pulled traction.  I then used the [employer] plate.  Once, I had identified the biceps tendon using a standard deltopectoral approach proximally to affix the plate and obtain reduction of the proximal humeral segment placing 5 lock screws into the head.  In the shaft region, used a tenaculum to achieve reduction.  After using the standard anterior approach with retraction of the biceps medially, splitting of the brachialis and securing this with a lag screw through the plate.  I continued distally along the distal shaft fragment placing 4 bicortical screws.  It should be noted that the fracture appeared to be much older than the 10 days as reported by the patient and had callus that required removal.  There was no acute fracture or hematoma rather again fibrinous and cartilaginous matrix.  Final images showed excellent restoration of alignment, appropriate reduction, hardware placement, trajectory and length.  Wound was irrigated thoroughly and then closed in standard layered fashion.  My assistant, Rebecca MoritaKeith Paul, PA-C assisted me throughout with exposure, reduction, fixation, and closure.  PROGNOSIS:  The patient will have unrestricted range of motion of the elbow and hand, but will be restricted from any active abduction of the shoulder.  She  does have some difficulty mobilizing and her son and primary caretaker just had an ankle fracture which will likely necessitate her admission to a skilled nursing facility.  Otherwise risk of complications will significantly increase.     Rebecca Hoffman, M.D.     M[facility]/MEDQ  D:  05/12/2016  T:  05/12/2016  Job:  [id number]

## 2016-11-25 DIAGNOSIS — I10 Essential (primary) hypertension: Secondary | ICD-10-CM | POA: Diagnosis not present

## 2016-11-25 DIAGNOSIS — H52223 Regular astigmatism, bilateral: Secondary | ICD-10-CM | POA: Diagnosis not present

## 2016-11-25 DIAGNOSIS — Z01818 Encounter for other preprocedural examination: Secondary | ICD-10-CM | POA: Diagnosis not present

## 2016-11-25 DIAGNOSIS — H26492 Other secondary cataract, left eye: Secondary | ICD-10-CM | POA: Diagnosis not present

## 2016-11-25 DIAGNOSIS — H25811 Combined forms of age-related cataract, right eye: Secondary | ICD-10-CM | POA: Diagnosis not present

## 2016-11-25 DIAGNOSIS — H524 Presbyopia: Secondary | ICD-10-CM | POA: Diagnosis not present

## 2016-11-25 DIAGNOSIS — H353221 Exudative age-related macular degeneration, left eye, with active choroidal neovascularization: Secondary | ICD-10-CM | POA: Diagnosis not present

## 2016-11-25 DIAGNOSIS — H5213 Myopia, bilateral: Secondary | ICD-10-CM | POA: Diagnosis not present

## 2016-12-10 DIAGNOSIS — H2511 Age-related nuclear cataract, right eye: Secondary | ICD-10-CM | POA: Diagnosis not present

## 2016-12-10 DIAGNOSIS — H25811 Combined forms of age-related cataract, right eye: Secondary | ICD-10-CM | POA: Diagnosis not present

## 2016-12-10 DIAGNOSIS — I10 Essential (primary) hypertension: Secondary | ICD-10-CM | POA: Diagnosis not present

## 2016-12-10 DIAGNOSIS — H18231 Secondary corneal edema, right eye: Secondary | ICD-10-CM | POA: Diagnosis not present

## 2016-12-10 DIAGNOSIS — Z961 Presence of intraocular lens: Secondary | ICD-10-CM | POA: Diagnosis not present

## 2016-12-18 DIAGNOSIS — H34832 Tributary (branch) retinal vein occlusion, left eye, with macular edema: Secondary | ICD-10-CM | POA: Diagnosis not present

## 2016-12-25 DIAGNOSIS — H34832 Tributary (branch) retinal vein occlusion, left eye, with macular edema: Secondary | ICD-10-CM | POA: Diagnosis not present

## 2017-01-07 DIAGNOSIS — H348321 Tributary (branch) retinal vein occlusion, left eye, with retinal neovascularization: Secondary | ICD-10-CM | POA: Diagnosis not present

## 2017-02-05 DIAGNOSIS — H34832 Tributary (branch) retinal vein occlusion, left eye, with macular edema: Secondary | ICD-10-CM | POA: Diagnosis not present

## 2017-03-19 DIAGNOSIS — H34832 Tributary (branch) retinal vein occlusion, left eye, with macular edema: Secondary | ICD-10-CM | POA: Diagnosis not present

## 2017-04-21 DIAGNOSIS — H5203 Hypermetropia, bilateral: Secondary | ICD-10-CM | POA: Diagnosis not present

## 2017-04-21 DIAGNOSIS — H524 Presbyopia: Secondary | ICD-10-CM | POA: Diagnosis not present

## 2017-04-21 DIAGNOSIS — H52223 Regular astigmatism, bilateral: Secondary | ICD-10-CM | POA: Diagnosis not present

## 2017-04-21 DIAGNOSIS — H59032 Cystoid macular edema following cataract surgery, left eye: Secondary | ICD-10-CM | POA: Diagnosis not present

## 2017-09-16 IMAGING — RF DG HUMERUS 2V *R*
1 series · 4 of 4 positions shown · non-contrast
Comparison: None

FLUOROSCOPY TIME:  0 minutes 24 seconds

Images obtained: 5

CLINICAL DATA: RIGHT humeral ORIF

EXAM:
DG C-ARM 61-120 MIN; RIGHT HUMERUS - 2+ VIEW

[Series 1: run · 4 of 4 slices shown]
[im 1/4]
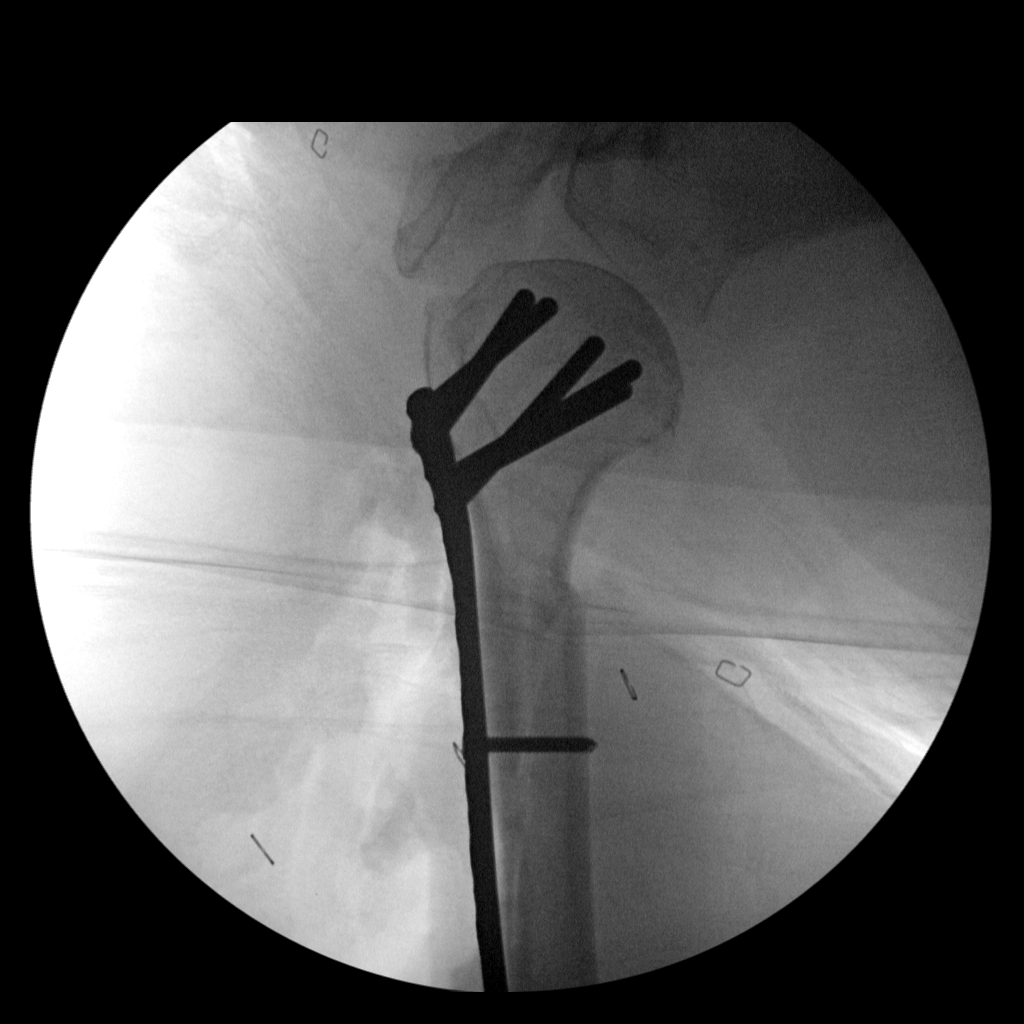
[im 2/4]
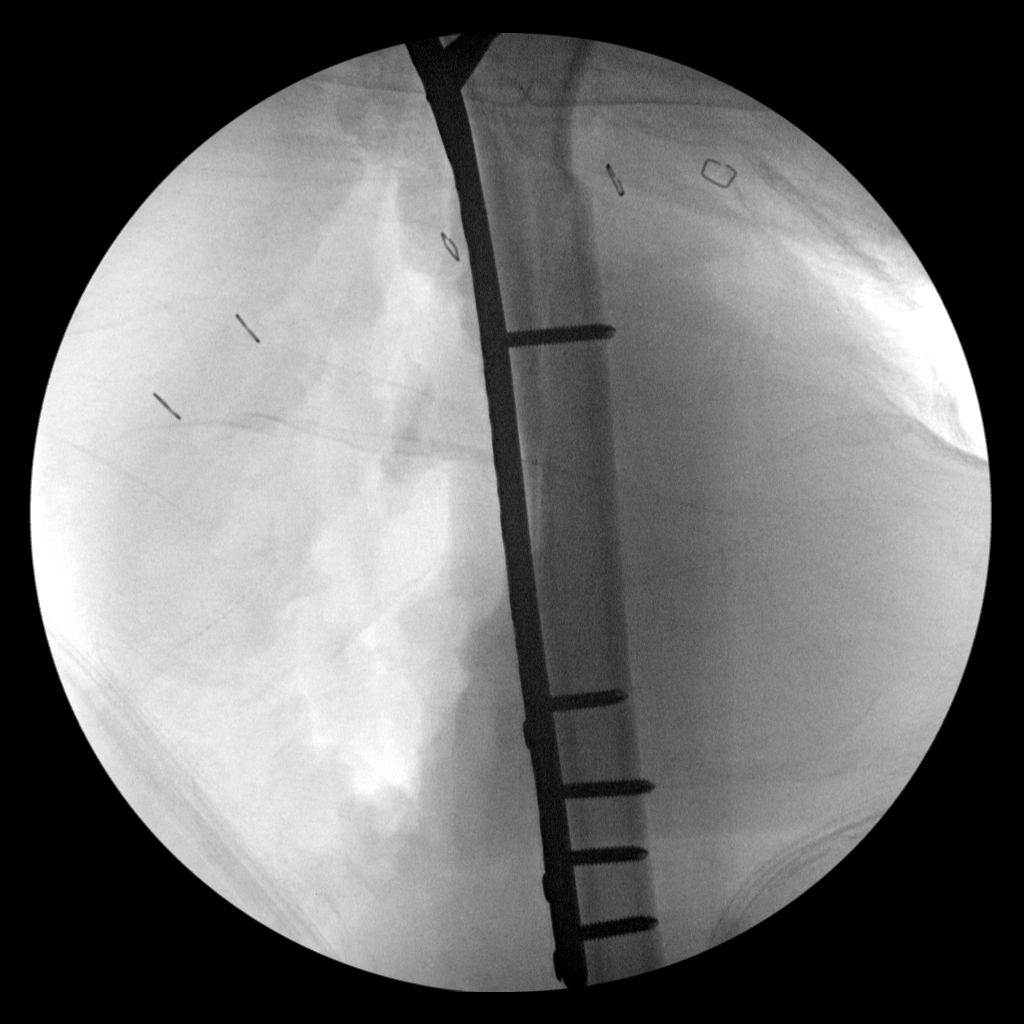
[im 3/4]
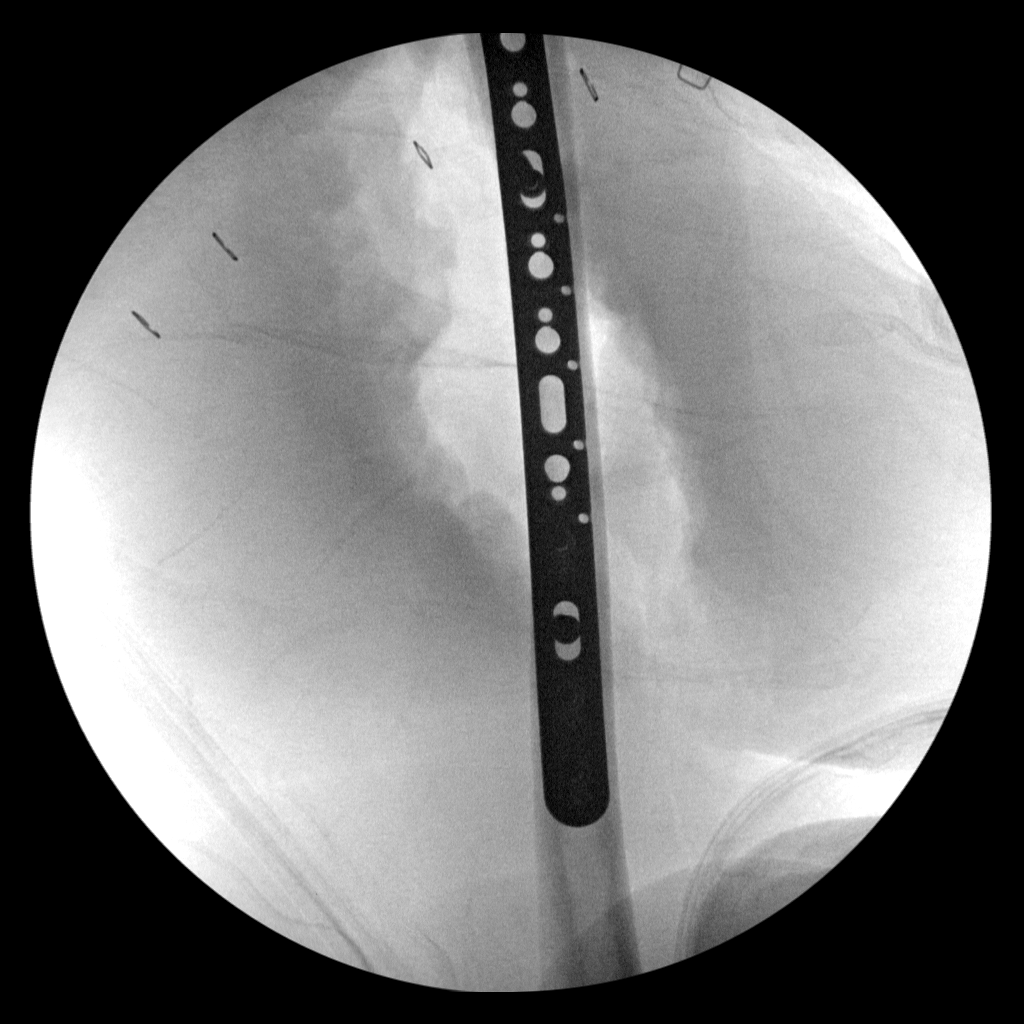
[im 4/4]
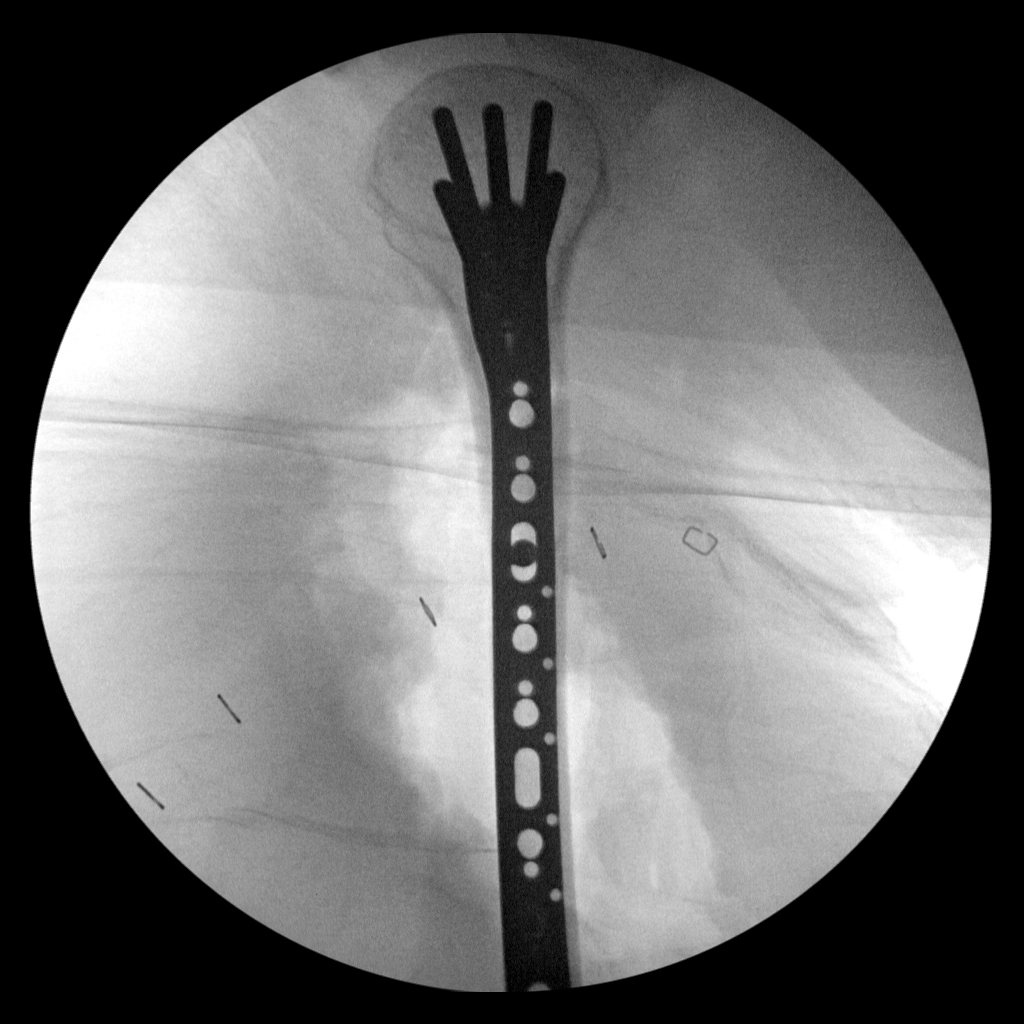

[4 of 4 positions shown; findings below may reference images not displayed]

FINDINGS: Plate and multiple screws/pegs at RIGHT humerus post ORIF of an
oblique proximal RIGHT humeral diaphyseal fracture.

Bones appear demineralized.

No additional fracture or dislocation.
IMPRESSION: Post ORIF of the proximal RIGHT humerus.

## 2019-02-11 ENCOUNTER — Other Ambulatory Visit: Payer: Self-pay

## 2019-05-08 DIAGNOSIS — M1612 Unilateral primary osteoarthritis, left hip: Secondary | ICD-10-CM | POA: Diagnosis not present

## 2019-05-08 DIAGNOSIS — M8949 Other hypertrophic osteoarthropathy, multiple sites: Secondary | ICD-10-CM | POA: Diagnosis not present

## 2019-05-08 DIAGNOSIS — M1712 Unilateral primary osteoarthritis, left knee: Secondary | ICD-10-CM | POA: Diagnosis not present

## 2019-12-24 DIAGNOSIS — I517 Cardiomegaly: Secondary | ICD-10-CM | POA: Diagnosis not present

## 2019-12-24 DIAGNOSIS — M7732 Calcaneal spur, left foot: Secondary | ICD-10-CM | POA: Diagnosis not present

## 2019-12-24 DIAGNOSIS — I340 Nonrheumatic mitral (valve) insufficiency: Secondary | ICD-10-CM | POA: Diagnosis not present

## 2019-12-24 DIAGNOSIS — M79672 Pain in left foot: Secondary | ICD-10-CM | POA: Diagnosis not present

## 2019-12-24 DIAGNOSIS — I4819 Other persistent atrial fibrillation: Secondary | ICD-10-CM | POA: Diagnosis not present

## 2019-12-24 DIAGNOSIS — M7989 Other specified soft tissue disorders: Secondary | ICD-10-CM | POA: Diagnosis not present

## 2019-12-24 DIAGNOSIS — R600 Localized edema: Secondary | ICD-10-CM | POA: Diagnosis not present

## 2019-12-24 DIAGNOSIS — I890 Lymphedema, not elsewhere classified: Secondary | ICD-10-CM | POA: Diagnosis not present

## 2019-12-24 DIAGNOSIS — R079 Chest pain, unspecified: Secondary | ICD-10-CM | POA: Diagnosis not present

## 2019-12-24 DIAGNOSIS — I4891 Unspecified atrial fibrillation: Secondary | ICD-10-CM | POA: Diagnosis not present

## 2019-12-24 DIAGNOSIS — S91322A Laceration with foreign body, left foot, initial encounter: Secondary | ICD-10-CM | POA: Diagnosis not present

## 2019-12-24 DIAGNOSIS — R0989 Other specified symptoms and signs involving the circulatory and respiratory systems: Secondary | ICD-10-CM | POA: Diagnosis not present

## 2019-12-24 DIAGNOSIS — R0902 Hypoxemia: Secondary | ICD-10-CM | POA: Diagnosis not present

## 2019-12-24 DIAGNOSIS — I700 Atherosclerosis of aorta: Secondary | ICD-10-CM | POA: Diagnosis not present

## 2019-12-24 DIAGNOSIS — J811 Chronic pulmonary edema: Secondary | ICD-10-CM | POA: Diagnosis not present

## 2019-12-24 DIAGNOSIS — I361 Nonrheumatic tricuspid (valve) insufficiency: Secondary | ICD-10-CM | POA: Diagnosis not present

## 2019-12-24 DIAGNOSIS — I509 Heart failure, unspecified: Secondary | ICD-10-CM | POA: Diagnosis not present

## 2019-12-25 DIAGNOSIS — Z6841 Body Mass Index (BMI) 40.0 and over, adult: Secondary | ICD-10-CM | POA: Diagnosis not present

## 2019-12-25 DIAGNOSIS — Z79899 Other long term (current) drug therapy: Secondary | ICD-10-CM | POA: Diagnosis not present

## 2019-12-25 DIAGNOSIS — Z880 Allergy status to penicillin: Secondary | ICD-10-CM | POA: Diagnosis not present

## 2019-12-25 DIAGNOSIS — F322 Major depressive disorder, single episode, severe without psychotic features: Secondary | ICD-10-CM | POA: Diagnosis not present

## 2019-12-25 DIAGNOSIS — Z7902 Long term (current) use of antithrombotics/antiplatelets: Secondary | ICD-10-CM | POA: Diagnosis not present

## 2019-12-25 DIAGNOSIS — I110 Hypertensive heart disease with heart failure: Secondary | ICD-10-CM | POA: Diagnosis not present

## 2019-12-25 DIAGNOSIS — I509 Heart failure, unspecified: Secondary | ICD-10-CM | POA: Diagnosis not present

## 2019-12-25 DIAGNOSIS — I4891 Unspecified atrial fibrillation: Secondary | ICD-10-CM | POA: Diagnosis not present

## 2019-12-25 DIAGNOSIS — J9601 Acute respiratory failure with hypoxia: Secondary | ICD-10-CM | POA: Diagnosis not present

## 2019-12-25 DIAGNOSIS — M1990 Unspecified osteoarthritis, unspecified site: Secondary | ICD-10-CM | POA: Diagnosis not present

## 2019-12-25 DIAGNOSIS — I10 Essential (primary) hypertension: Secondary | ICD-10-CM | POA: Diagnosis not present

## 2019-12-25 DIAGNOSIS — F4329 Adjustment disorder with other symptoms: Secondary | ICD-10-CM | POA: Diagnosis not present

## 2019-12-25 DIAGNOSIS — I5022 Chronic systolic (congestive) heart failure: Secondary | ICD-10-CM | POA: Diagnosis not present

## 2019-12-25 DIAGNOSIS — I5021 Acute systolic (congestive) heart failure: Secondary | ICD-10-CM | POA: Diagnosis not present

## 2019-12-25 DIAGNOSIS — R079 Chest pain, unspecified: Secondary | ICD-10-CM | POA: Diagnosis not present

## 2019-12-25 DIAGNOSIS — R0902 Hypoxemia: Secondary | ICD-10-CM | POA: Diagnosis not present

## 2019-12-25 DIAGNOSIS — R5381 Other malaise: Secondary | ICD-10-CM | POA: Diagnosis not present

## 2019-12-25 DIAGNOSIS — I4819 Other persistent atrial fibrillation: Secondary | ICD-10-CM | POA: Diagnosis not present

## 2019-12-25 DIAGNOSIS — Z7401 Bed confinement status: Secondary | ICD-10-CM | POA: Diagnosis not present

## 2019-12-25 DIAGNOSIS — I340 Nonrheumatic mitral (valve) insufficiency: Secondary | ICD-10-CM | POA: Diagnosis not present

## 2019-12-25 DIAGNOSIS — R600 Localized edema: Secondary | ICD-10-CM | POA: Diagnosis not present

## 2019-12-25 DIAGNOSIS — F418 Other specified anxiety disorders: Secondary | ICD-10-CM | POA: Diagnosis not present

## 2019-12-25 DIAGNOSIS — I361 Nonrheumatic tricuspid (valve) insufficiency: Secondary | ICD-10-CM | POA: Diagnosis not present

## 2019-12-27 DIAGNOSIS — D649 Anemia, unspecified: Secondary | ICD-10-CM | POA: Diagnosis not present

## 2019-12-27 DIAGNOSIS — R5381 Other malaise: Secondary | ICD-10-CM | POA: Diagnosis not present

## 2019-12-27 DIAGNOSIS — Z7401 Bed confinement status: Secondary | ICD-10-CM | POA: Diagnosis not present

## 2019-12-27 DIAGNOSIS — I119 Hypertensive heart disease without heart failure: Secondary | ICD-10-CM | POA: Diagnosis not present

## 2019-12-27 DIAGNOSIS — I509 Heart failure, unspecified: Secondary | ICD-10-CM | POA: Diagnosis not present

## 2019-12-27 DIAGNOSIS — R0902 Hypoxemia: Secondary | ICD-10-CM | POA: Diagnosis not present

## 2019-12-27 DIAGNOSIS — I10 Essential (primary) hypertension: Secondary | ICD-10-CM | POA: Diagnosis not present

## 2019-12-27 DIAGNOSIS — Z79899 Other long term (current) drug therapy: Secondary | ICD-10-CM | POA: Diagnosis not present

## 2019-12-27 DIAGNOSIS — I4891 Unspecified atrial fibrillation: Secondary | ICD-10-CM | POA: Diagnosis not present

## 2019-12-27 DIAGNOSIS — I340 Nonrheumatic mitral (valve) insufficiency: Secondary | ICD-10-CM | POA: Diagnosis not present

## 2019-12-27 DIAGNOSIS — R0602 Shortness of breath: Secondary | ICD-10-CM | POA: Diagnosis not present

## 2019-12-27 DIAGNOSIS — I4819 Other persistent atrial fibrillation: Secondary | ICD-10-CM | POA: Diagnosis not present

## 2019-12-27 DIAGNOSIS — I361 Nonrheumatic tricuspid (valve) insufficiency: Secondary | ICD-10-CM | POA: Diagnosis not present

## 2019-12-27 DIAGNOSIS — F322 Major depressive disorder, single episode, severe without psychotic features: Secondary | ICD-10-CM | POA: Diagnosis not present

## 2019-12-27 DIAGNOSIS — I5022 Chronic systolic (congestive) heart failure: Secondary | ICD-10-CM | POA: Diagnosis not present

## 2019-12-27 DIAGNOSIS — F4329 Adjustment disorder with other symptoms: Secondary | ICD-10-CM | POA: Diagnosis not present

## 2019-12-27 DIAGNOSIS — F4323 Adjustment disorder with mixed anxiety and depressed mood: Secondary | ICD-10-CM | POA: Diagnosis not present

## 2019-12-27 DIAGNOSIS — R262 Difficulty in walking, not elsewhere classified: Secondary | ICD-10-CM | POA: Diagnosis not present

## 2019-12-28 DIAGNOSIS — R262 Difficulty in walking, not elsewhere classified: Secondary | ICD-10-CM | POA: Diagnosis not present

## 2019-12-28 DIAGNOSIS — I509 Heart failure, unspecified: Secondary | ICD-10-CM | POA: Diagnosis not present

## 2019-12-28 DIAGNOSIS — I119 Hypertensive heart disease without heart failure: Secondary | ICD-10-CM | POA: Diagnosis not present

## 2019-12-28 DIAGNOSIS — I4891 Unspecified atrial fibrillation: Secondary | ICD-10-CM | POA: Diagnosis not present

## 2020-01-17 DIAGNOSIS — R531 Weakness: Secondary | ICD-10-CM | POA: Diagnosis not present

## 2020-01-24 DIAGNOSIS — E039 Hypothyroidism, unspecified: Secondary | ICD-10-CM | POA: Diagnosis not present

## 2020-01-24 DIAGNOSIS — F419 Anxiety disorder, unspecified: Secondary | ICD-10-CM

## 2020-01-24 DIAGNOSIS — J309 Allergic rhinitis, unspecified: Secondary | ICD-10-CM | POA: Insufficient documentation

## 2020-01-24 DIAGNOSIS — F32A Depression, unspecified: Secondary | ICD-10-CM

## 2020-01-24 DIAGNOSIS — I10 Essential (primary) hypertension: Secondary | ICD-10-CM | POA: Diagnosis not present

## 2020-01-24 DIAGNOSIS — F321 Major depressive disorder, single episode, moderate: Secondary | ICD-10-CM | POA: Diagnosis not present

## 2020-01-24 DIAGNOSIS — I482 Chronic atrial fibrillation, unspecified: Secondary | ICD-10-CM | POA: Insufficient documentation

## 2020-01-24 DIAGNOSIS — R5383 Other fatigue: Secondary | ICD-10-CM | POA: Diagnosis not present

## 2020-01-24 DIAGNOSIS — Z6841 Body Mass Index (BMI) 40.0 and over, adult: Secondary | ICD-10-CM | POA: Diagnosis not present

## 2020-01-24 DIAGNOSIS — L989 Disorder of the skin and subcutaneous tissue, unspecified: Secondary | ICD-10-CM | POA: Diagnosis not present

## 2020-01-24 DIAGNOSIS — R5381 Other malaise: Secondary | ICD-10-CM | POA: Diagnosis not present

## 2020-01-24 DIAGNOSIS — K219 Gastro-esophageal reflux disease without esophagitis: Secondary | ICD-10-CM

## 2020-01-24 HISTORY — DX: Anxiety disorder, unspecified: F41.9

## 2020-01-24 HISTORY — DX: Allergic rhinitis, unspecified: J30.9

## 2020-01-24 HISTORY — DX: Morbid (severe) obesity due to excess calories: E66.01

## 2020-01-24 HISTORY — DX: Body Mass Index (BMI) 40.0 and over, adult: Z684

## 2020-01-24 HISTORY — DX: Chronic atrial fibrillation, unspecified: I48.20

## 2020-01-24 HISTORY — DX: Depression, unspecified: F32.A

## 2020-01-24 HISTORY — DX: Gastro-esophageal reflux disease without esophagitis: K21.9

## 2020-01-26 DIAGNOSIS — M810 Age-related osteoporosis without current pathological fracture: Secondary | ICD-10-CM | POA: Diagnosis not present

## 2020-01-26 DIAGNOSIS — I890 Lymphedema, not elsewhere classified: Secondary | ICD-10-CM | POA: Diagnosis not present

## 2020-01-26 DIAGNOSIS — I2510 Atherosclerotic heart disease of native coronary artery without angina pectoris: Secondary | ICD-10-CM | POA: Diagnosis not present

## 2020-01-26 DIAGNOSIS — I081 Rheumatic disorders of both mitral and tricuspid valves: Secondary | ICD-10-CM | POA: Diagnosis not present

## 2020-01-26 DIAGNOSIS — Z9181 History of falling: Secondary | ICD-10-CM | POA: Diagnosis not present

## 2020-01-26 DIAGNOSIS — G8929 Other chronic pain: Secondary | ICD-10-CM | POA: Diagnosis not present

## 2020-01-26 DIAGNOSIS — R32 Unspecified urinary incontinence: Secondary | ICD-10-CM | POA: Diagnosis not present

## 2020-01-26 DIAGNOSIS — L409 Psoriasis, unspecified: Secondary | ICD-10-CM | POA: Diagnosis not present

## 2020-01-26 DIAGNOSIS — I5021 Acute systolic (congestive) heart failure: Secondary | ICD-10-CM | POA: Diagnosis not present

## 2020-01-26 DIAGNOSIS — I480 Paroxysmal atrial fibrillation: Secondary | ICD-10-CM | POA: Diagnosis not present

## 2020-01-26 DIAGNOSIS — F321 Major depressive disorder, single episode, moderate: Secondary | ICD-10-CM | POA: Diagnosis not present

## 2020-01-26 DIAGNOSIS — F4321 Adjustment disorder with depressed mood: Secondary | ICD-10-CM | POA: Diagnosis not present

## 2020-01-26 DIAGNOSIS — M1990 Unspecified osteoarthritis, unspecified site: Secondary | ICD-10-CM | POA: Diagnosis not present

## 2020-01-26 DIAGNOSIS — I110 Hypertensive heart disease with heart failure: Secondary | ICD-10-CM | POA: Diagnosis not present

## 2020-01-26 DIAGNOSIS — F419 Anxiety disorder, unspecified: Secondary | ICD-10-CM | POA: Diagnosis not present

## 2020-01-26 DIAGNOSIS — Z7901 Long term (current) use of anticoagulants: Secondary | ICD-10-CM | POA: Diagnosis not present

## 2020-01-26 DIAGNOSIS — K219 Gastro-esophageal reflux disease without esophagitis: Secondary | ICD-10-CM | POA: Diagnosis not present

## 2020-01-26 DIAGNOSIS — Z6841 Body Mass Index (BMI) 40.0 and over, adult: Secondary | ICD-10-CM | POA: Diagnosis not present

## 2020-02-25 DIAGNOSIS — R32 Unspecified urinary incontinence: Secondary | ICD-10-CM | POA: Diagnosis not present

## 2020-02-25 DIAGNOSIS — I890 Lymphedema, not elsewhere classified: Secondary | ICD-10-CM | POA: Diagnosis not present

## 2020-02-25 DIAGNOSIS — M810 Age-related osteoporosis without current pathological fracture: Secondary | ICD-10-CM | POA: Diagnosis not present

## 2020-02-25 DIAGNOSIS — F419 Anxiety disorder, unspecified: Secondary | ICD-10-CM | POA: Diagnosis not present

## 2020-02-25 DIAGNOSIS — I110 Hypertensive heart disease with heart failure: Secondary | ICD-10-CM | POA: Diagnosis not present

## 2020-02-25 DIAGNOSIS — F321 Major depressive disorder, single episode, moderate: Secondary | ICD-10-CM | POA: Diagnosis not present

## 2020-02-25 DIAGNOSIS — L409 Psoriasis, unspecified: Secondary | ICD-10-CM | POA: Diagnosis not present

## 2020-02-25 DIAGNOSIS — I081 Rheumatic disorders of both mitral and tricuspid valves: Secondary | ICD-10-CM | POA: Diagnosis not present

## 2020-02-25 DIAGNOSIS — F4321 Adjustment disorder with depressed mood: Secondary | ICD-10-CM | POA: Diagnosis not present

## 2020-02-25 DIAGNOSIS — Z6841 Body Mass Index (BMI) 40.0 and over, adult: Secondary | ICD-10-CM | POA: Diagnosis not present

## 2020-02-25 DIAGNOSIS — I2510 Atherosclerotic heart disease of native coronary artery without angina pectoris: Secondary | ICD-10-CM | POA: Diagnosis not present

## 2020-02-25 DIAGNOSIS — Z7901 Long term (current) use of anticoagulants: Secondary | ICD-10-CM | POA: Diagnosis not present

## 2020-02-25 DIAGNOSIS — I480 Paroxysmal atrial fibrillation: Secondary | ICD-10-CM | POA: Diagnosis not present

## 2020-02-25 DIAGNOSIS — G8929 Other chronic pain: Secondary | ICD-10-CM | POA: Diagnosis not present

## 2020-02-25 DIAGNOSIS — I5021 Acute systolic (congestive) heart failure: Secondary | ICD-10-CM | POA: Diagnosis not present

## 2020-02-25 DIAGNOSIS — K219 Gastro-esophageal reflux disease without esophagitis: Secondary | ICD-10-CM | POA: Diagnosis not present

## 2020-02-25 DIAGNOSIS — M1990 Unspecified osteoarthritis, unspecified site: Secondary | ICD-10-CM | POA: Diagnosis not present

## 2020-02-25 DIAGNOSIS — Z9181 History of falling: Secondary | ICD-10-CM | POA: Diagnosis not present

## 2020-03-26 DIAGNOSIS — Z7901 Long term (current) use of anticoagulants: Secondary | ICD-10-CM | POA: Diagnosis not present

## 2020-03-26 DIAGNOSIS — K219 Gastro-esophageal reflux disease without esophagitis: Secondary | ICD-10-CM | POA: Diagnosis not present

## 2020-03-26 DIAGNOSIS — F4321 Adjustment disorder with depressed mood: Secondary | ICD-10-CM | POA: Diagnosis not present

## 2020-03-26 DIAGNOSIS — F419 Anxiety disorder, unspecified: Secondary | ICD-10-CM | POA: Diagnosis not present

## 2020-03-26 DIAGNOSIS — M810 Age-related osteoporosis without current pathological fracture: Secondary | ICD-10-CM | POA: Diagnosis not present

## 2020-03-26 DIAGNOSIS — I081 Rheumatic disorders of both mitral and tricuspid valves: Secondary | ICD-10-CM | POA: Diagnosis not present

## 2020-03-26 DIAGNOSIS — I110 Hypertensive heart disease with heart failure: Secondary | ICD-10-CM | POA: Diagnosis not present

## 2020-03-26 DIAGNOSIS — I480 Paroxysmal atrial fibrillation: Secondary | ICD-10-CM | POA: Diagnosis not present

## 2020-03-26 DIAGNOSIS — L409 Psoriasis, unspecified: Secondary | ICD-10-CM | POA: Diagnosis not present

## 2020-03-26 DIAGNOSIS — I890 Lymphedema, not elsewhere classified: Secondary | ICD-10-CM | POA: Diagnosis not present

## 2020-03-26 DIAGNOSIS — I2510 Atherosclerotic heart disease of native coronary artery without angina pectoris: Secondary | ICD-10-CM | POA: Diagnosis not present

## 2020-03-26 DIAGNOSIS — I5021 Acute systolic (congestive) heart failure: Secondary | ICD-10-CM | POA: Diagnosis not present

## 2020-03-26 DIAGNOSIS — G8929 Other chronic pain: Secondary | ICD-10-CM | POA: Diagnosis not present

## 2020-03-26 DIAGNOSIS — M1990 Unspecified osteoarthritis, unspecified site: Secondary | ICD-10-CM | POA: Diagnosis not present

## 2020-03-26 DIAGNOSIS — F321 Major depressive disorder, single episode, moderate: Secondary | ICD-10-CM | POA: Diagnosis not present

## 2020-03-26 DIAGNOSIS — R32 Unspecified urinary incontinence: Secondary | ICD-10-CM | POA: Diagnosis not present

## 2020-03-26 DIAGNOSIS — Z6841 Body Mass Index (BMI) 40.0 and over, adult: Secondary | ICD-10-CM | POA: Diagnosis not present

## 2020-03-26 DIAGNOSIS — Z9181 History of falling: Secondary | ICD-10-CM | POA: Diagnosis not present

## 2020-04-03 ENCOUNTER — Other Ambulatory Visit: Payer: Self-pay

## 2020-04-03 ENCOUNTER — Ambulatory Visit: Payer: PPO | Admitting: Licensed Clinical Social Worker

## 2020-04-03 DIAGNOSIS — Z634 Disappearance and death of family member: Secondary | ICD-10-CM

## 2020-04-08 NOTE — Progress Notes (Signed)
   THERAPIST PROGRESS NOTE  Session Time:  Participation Level: Active  Behavioral Response: Euthymic  Type of Therapy: Individual Therapy  Treatment Goals addressed: Coping  Interventions: Strength-based  Summary: Rebecca Hoffman is a 79 y.o. female who presents with symptoms of her diagnosis.  Patient reports that she is sad and having difficulty with dealing with life after the death of her son.  Patient was able to discuss her daily activities since she moved into [employer] [facility].  She reports that she rarely has activities to do and stays in her room about 4 out of 7 days per week. Provided education about the grief/loss process. Gave homework at the end of the session.   Suicidal/Homicidal: No  Therapist Response: Therapist actively listened throughout the session.  Therapist provided psychoeducation on grief/loss.  Plan: Return again in 1 weeks.  Diagnosis: Axis I: Bereavement    Axis II: No diagnosis    Marinda Elk, LCSW 04/08/2020

## 2020-04-24 ENCOUNTER — Other Ambulatory Visit: Payer: Self-pay

## 2020-04-24 ENCOUNTER — Ambulatory Visit (INDEPENDENT_AMBULATORY_CARE_PROVIDER_SITE_OTHER): Payer: PPO | Admitting: Licensed Clinical Social Worker

## 2020-04-24 DIAGNOSIS — F4323 Adjustment disorder with mixed anxiety and depressed mood: Secondary | ICD-10-CM

## 2020-04-24 DIAGNOSIS — Z634 Disappearance and death of family member: Secondary | ICD-10-CM | POA: Diagnosis not present

## 2020-04-24 NOTE — Progress Notes (Signed)
Virtual Visit via Telephone Note  I connected with Rebecca Hoffman on 04/24/20 at  1:30 PM EDT by telephone and verified that I am speaking with the correct person using two identifiers.  Location: Patient: home Provider: [facility]   I discussed the limitations, risks, security and privacy concerns of performing an evaluation and management service by telephone and the availability of in person appointments. I also discussed with the patient that there may be a patient responsible charge related to this service. The patient expressed understanding and agreed to proceed.   I discussed the assessment and treatment plan with the patient. The patient was provided an opportunity to ask questions and all were answered. The patient agreed with the plan and demonstrated an understanding of the instructions.   The patient was advised to call back or seek an in-person evaluation if the symptoms worsen or if the condition fails to improve as anticipated.  I provided 60 minutes of non-face-to-face time during this encounter.   Kyuss Hale R Webber Michiels, LCSW   THERAPIST PROGRESS NOTE  Session Time: 1:30-2:30 p  Participation Level: Active  Behavioral Response: NAAlertAnxious  Type of Therapy: Individual Therapy  Treatment Goals addressed: Coping  Interventions: Solution Focused and Supportive  Summary: Rebecca Hoffman is a 79 y.o. female who presents with symptoms related to bereavement and adjustment disorder diagnosis. Pt recently moved into retirement facility--in a big adjustment phase right now.   Allowed pt to explore and express thoughts and feelings associated with recent move into the retirement community and the sale of her condo that was home for 30+ years.   Pt upset that manager told her that she could not keep all of her "stuff" and would have to adhere to policies. Pt feels there is internal politics and is upset that the manager was disrespectful in front of other residents.  Discussed  thoughts and feelings associated with continuing grief.  Encouraged pt to focus on self care and life balance.   Suicidal/Homicidal: No  Therapist Response: Developed treatment plan.   Plan: Return again in 3 weeks.The ongoing treatment plan includes maintaining current levels of progress and continuing to build skills to manage mood, improve stress/anxiety management, emotion regulation, distress tolerance, and behavior modification.   Diagnosis: Axis I: Adjustment disorder; Bereavement    Axis II: No diagnosis    [name] Berkeley Veldman, LCSW 04/24/2020

## 2020-04-25 DIAGNOSIS — I2510 Atherosclerotic heart disease of native coronary artery without angina pectoris: Secondary | ICD-10-CM | POA: Diagnosis not present

## 2020-04-25 DIAGNOSIS — R32 Unspecified urinary incontinence: Secondary | ICD-10-CM | POA: Diagnosis not present

## 2020-04-25 DIAGNOSIS — I081 Rheumatic disorders of both mitral and tricuspid valves: Secondary | ICD-10-CM | POA: Diagnosis not present

## 2020-04-25 DIAGNOSIS — I890 Lymphedema, not elsewhere classified: Secondary | ICD-10-CM | POA: Diagnosis not present

## 2020-04-25 DIAGNOSIS — L409 Psoriasis, unspecified: Secondary | ICD-10-CM | POA: Diagnosis not present

## 2020-04-25 DIAGNOSIS — I5021 Acute systolic (congestive) heart failure: Secondary | ICD-10-CM | POA: Diagnosis not present

## 2020-04-25 DIAGNOSIS — Z7901 Long term (current) use of anticoagulants: Secondary | ICD-10-CM | POA: Diagnosis not present

## 2020-04-25 DIAGNOSIS — G8929 Other chronic pain: Secondary | ICD-10-CM | POA: Diagnosis not present

## 2020-04-25 DIAGNOSIS — F321 Major depressive disorder, single episode, moderate: Secondary | ICD-10-CM | POA: Diagnosis not present

## 2020-04-25 DIAGNOSIS — I480 Paroxysmal atrial fibrillation: Secondary | ICD-10-CM | POA: Diagnosis not present

## 2020-04-25 DIAGNOSIS — Z9181 History of falling: Secondary | ICD-10-CM | POA: Diagnosis not present

## 2020-04-25 DIAGNOSIS — F4321 Adjustment disorder with depressed mood: Secondary | ICD-10-CM | POA: Diagnosis not present

## 2020-04-25 DIAGNOSIS — M1990 Unspecified osteoarthritis, unspecified site: Secondary | ICD-10-CM | POA: Diagnosis not present

## 2020-04-25 DIAGNOSIS — Z6841 Body Mass Index (BMI) 40.0 and over, adult: Secondary | ICD-10-CM | POA: Diagnosis not present

## 2020-04-25 DIAGNOSIS — F419 Anxiety disorder, unspecified: Secondary | ICD-10-CM | POA: Diagnosis not present

## 2020-04-25 DIAGNOSIS — M810 Age-related osteoporosis without current pathological fracture: Secondary | ICD-10-CM | POA: Diagnosis not present

## 2020-04-25 DIAGNOSIS — K219 Gastro-esophageal reflux disease without esophagitis: Secondary | ICD-10-CM | POA: Diagnosis not present

## 2020-04-25 DIAGNOSIS — I110 Hypertensive heart disease with heart failure: Secondary | ICD-10-CM | POA: Diagnosis not present

## 2020-05-14 ENCOUNTER — Ambulatory Visit (INDEPENDENT_AMBULATORY_CARE_PROVIDER_SITE_OTHER): Payer: PPO | Admitting: Licensed Clinical Social Worker

## 2020-05-14 ENCOUNTER — Other Ambulatory Visit: Payer: Self-pay

## 2020-05-14 DIAGNOSIS — F4323 Adjustment disorder with mixed anxiety and depressed mood: Secondary | ICD-10-CM

## 2020-05-14 NOTE — Progress Notes (Signed)
.  Virtual Visit via Telephone Note  I connected with Rebecca Hoffman on 05/14/20 at  2:30 PM EDT by telephone and verified that I am speaking with the correct person using two identifiers.  Location: Patient: home Provider: [facility]   I discussed the limitations, risks, security and privacy concerns of performing an evaluation and management service by telephone and the availability of in person appointments. I also discussed with the patient that there may be a patient responsible charge related to this service. The patient expressed understanding and agreed to proceed.   I discussed the assessment and treatment plan with the patient. The patient was provided an opportunity to ask questions and all were answered. The patient agreed with the plan and demonstrated an understanding of the instructions.   The patient was advised to call back or seek an in-person evaluation if the symptoms worsen or if the condition fails to improve as anticipated.  I provided 30 minutes of non-face-to-face time during this encounter.   Rebecca Mccrory R Saraih Lorton, LCSW    THERAPIST PROGRESS NOTE  Session Time: 2:30-3:00p  Participation Level: Active  Behavioral Response: NAAlertAnxious  Type of Therapy: Individual Therapy  Treatment Goals addressed: Anxiety  Interventions: Solution Focused and Supportive  Summary: Rebecca Hoffman is a 79 y.o. female who presents with continuing symptoms associated with adjustment disorder diagnosis. Pt reports that she is continuing to have anxiety/stress centered around her new home environment.  Allowed pt to explore and express thoughts and feelings surrounding the move to senior living and how she feels about individuals coming into her apartment. Pt reports that the housekeeper will open her apartment door for people to come in and "help" with tasks that need to be done. Pt reports that she has had no prior discussions with them or arranged that they come into her apartment.   This seems to be creating stress with pt, but its unclear whether this is part of her "senior living" services that are provided with her apartment.  Pt is still upset with apartment manager for making her move pieces of furniture out of apartment "for no reason".  Pt received a bill for $700.00 for moving the furniture out.  The manager is charging pt 30/month for keeping the pieces of furniture at her home.   Reviewed importance of sleep hygiene behaviors and relaxation/mindfulness to help manage stress/anxiety.    Suicidal/Homicidal: No  SI, HI, or AVH reported at time of session. Pt slightly paranoid about staff at senior living establishment.   Therapist Response: Rebecca Hoffman reports that anxiety/stress symptoms are still being triggered by apartment staff, which is reflective of fluctuating/intermittent progress.   Plan: Return again in 4 weeks. The ongoing treatment plan includes maintaining current levels of progress and continuing to build skills to manage mood, improve stress/anxiety management, emotion regulation, distress tolerance, and behavior modification.   Diagnosis: Axis I: Adjustment Disorder with Mixed Emotional Features    Axis II: No diagnosis    Rebecca Hoffman Rebecca Tinch, LCSW 05/14/2020

## 2020-06-18 ENCOUNTER — Other Ambulatory Visit: Payer: Self-pay

## 2020-06-18 ENCOUNTER — Ambulatory Visit (INDEPENDENT_AMBULATORY_CARE_PROVIDER_SITE_OTHER): Payer: PPO | Admitting: Licensed Clinical Social Worker

## 2020-06-18 DIAGNOSIS — F4323 Adjustment disorder with mixed anxiety and depressed mood: Secondary | ICD-10-CM

## 2020-06-18 DIAGNOSIS — Z634 Disappearance and death of family member: Secondary | ICD-10-CM | POA: Diagnosis not present

## 2020-06-18 NOTE — Progress Notes (Signed)
Virtual Visit via Telephone Note  I connected with [name] on 06/18/20 at  1:30 PM EST by telephone and verified that I am speaking with the correct person using two identifiers.  Location: Patient: home Provider: [facility]   I discussed the limitations, risks, security and privacy concerns of performing an evaluation and management service by telephone and the availability of in person appointments. I also discussed with the patient that there may be a patient responsible charge related to this service. The patient expressed understanding and agreed to proceed.  The patient was advised to call back or seek an in-person evaluation if the symptoms worsen or if the condition fails to improve as anticipated.  I provided 45 minutes of non-face-to-face time during this encounter.   Ethelyn Cerniglia R Devonte Migues, LCSW   THERAPIST PROGRESS NOTE  Session Time: 1:30-2:20p  Participation Level: Active  Behavioral Response: NAAlertAnxious  Type of Therapy: Individual Therapy  Treatment Goals addressed: Anxiety and Coping  Interventions: Supportive and Other: grief counseling  Summary: Rebecca Hoffman is a 79 y.o. female who presents with symptoms consistent with adjustment disorder and bereavement. [name] is still adjusting after selling her condo and moving into a retirement community.  Pt is continuing to mourn loss of son--grief has been triggered recently by the start of holiday season.  Allowed pt to explore and express thoughts and feelings surrounding the day of son's death. Pt was able to describe details, and process through some feelings that have carried over from this trauma.   Discussed relationship with nephew--only living family member currently.   [name] continues to have mistrust towards the individuals that are managing her community--feels like they have something against her. This is triggering a lot of negative thoughts about every decision management has made recently that has  directly impacted [name] in some way.  Allowed pt to reflect back on other areas and identify some positives.  Encouraged continuing self care, positive social supports and adequate rest (pt has not been sleeping good). Reviewed sleep hygiene.   Suicidal/Homicidal: No  Therapist Response: [name] continues to manage overall anxiety symptoms and grief symptoms well, which is indicative of overall progress. Treatment showing good evolution and development.   Plan: Return again in 4 weeks.  Diagnosis: Axis I: Adjustment Disorder with Mixed Emotional Features and Bereavement    Axis II: No diagnosis    [name], LCSW 06/18/2020

## 2020-07-19 ENCOUNTER — Ambulatory Visit (INDEPENDENT_AMBULATORY_CARE_PROVIDER_SITE_OTHER): Payer: PPO | Admitting: Licensed Clinical Social Worker

## 2020-07-19 ENCOUNTER — Other Ambulatory Visit: Payer: Self-pay

## 2020-07-19 DIAGNOSIS — F4323 Adjustment disorder with mixed anxiety and depressed mood: Secondary | ICD-10-CM | POA: Diagnosis not present

## 2020-07-19 DIAGNOSIS — Z634 Disappearance and death of family member: Secondary | ICD-10-CM

## 2020-07-19 NOTE — Progress Notes (Signed)
Virtual Visit via Telephone Note  I connected with Rebecca Hoffman on 07/19/20 at  2:00 PM EST by telephone and verified that I am speaking with the correct person using two identifiers.  Location: Patient: home Provider: [facility]   I discussed the limitations, risks, security and privacy concerns of performing an evaluation and management service by telephone and the availability of in person appointments. I also discussed with the patient that there may be a patient responsible charge related to this service. The patient expressed understanding and agreed to proceed.   The patient was advised to call back or seek an in-person evaluation if the symptoms worsen or if the condition fails to improve as anticipated.  I provided 45 minutes of non-face-to-face time during this encounter.   Rebecca Mcintire R Damean Poffenberger, LCSW   THERAPIST PROGRESS NOTE  Session Time: 2-2:45p  Participation Level: Active  Behavioral Response: Neat and Well GroomedAlertAnxious  Type of Therapy: Individual Therapy  Treatment Goals addressed: Anxiety  Interventions: Supportive and Reframing  Summary: Rebecca Hoffman is a 80 y.o. female who presents with continuing symptoms related to adjustment disorder and bereavement. Pt is continuing to struggle with the psychological impact of selling her condo and moving into assisted living facility.  Pt is perceiving hostility from Production designer, theatre/television/film, housekeeper, and other staff members.   Allowed pt to explore thoughts and feelings triggered by the holidays and allowed pt to share how she is applying coping skills into real-life situations. Discussed communication with housekeeper and how it often can be a frustration trigger for pt. Reviewed anxiety management and problem solving strategies.   Pt has a meeting with manager next week: feels like it could be an opportunity for her to share her thoughts about living in the community--and an opportunity for pt to hear feedback.    Suicidal/Homicidal: No  Therapist Response: [name] is continuing to have more anxiety around social interactions and happenings around her facility. Added anxiety management/treatment to overall treatment plan and reviewed with pt.   Plan: Return again in 3 weeks.  Diagnosis: Axis I: Adjustment Disorder with Mixed Emotional Features and Bereavement    Axis II: No diagnosis    [name] Roran Wegner, LCSW 07/19/2020

## 2020-07-26 DIAGNOSIS — I10 Essential (primary) hypertension: Secondary | ICD-10-CM | POA: Diagnosis not present

## 2020-07-26 DIAGNOSIS — F419 Anxiety disorder, unspecified: Secondary | ICD-10-CM | POA: Diagnosis not present

## 2020-07-26 DIAGNOSIS — E039 Hypothyroidism, unspecified: Secondary | ICD-10-CM

## 2020-07-26 DIAGNOSIS — F321 Major depressive disorder, single episode, moderate: Secondary | ICD-10-CM | POA: Diagnosis not present

## 2020-07-26 DIAGNOSIS — K219 Gastro-esophageal reflux disease without esophagitis: Secondary | ICD-10-CM | POA: Diagnosis not present

## 2020-07-26 DIAGNOSIS — Z6841 Body Mass Index (BMI) 40.0 and over, adult: Secondary | ICD-10-CM | POA: Diagnosis not present

## 2020-07-26 DIAGNOSIS — R5381 Other malaise: Secondary | ICD-10-CM

## 2020-07-26 DIAGNOSIS — F5101 Primary insomnia: Secondary | ICD-10-CM | POA: Diagnosis not present

## 2020-07-26 DIAGNOSIS — E876 Hypokalemia: Secondary | ICD-10-CM | POA: Diagnosis not present

## 2020-07-26 DIAGNOSIS — I482 Chronic atrial fibrillation, unspecified: Secondary | ICD-10-CM | POA: Diagnosis not present

## 2020-07-26 DIAGNOSIS — R5383 Other fatigue: Secondary | ICD-10-CM | POA: Diagnosis not present

## 2020-07-26 HISTORY — DX: Other malaise: R53.81

## 2020-07-26 HISTORY — DX: Hypothyroidism, unspecified: E03.9

## 2020-08-06 ENCOUNTER — Ambulatory Visit (INDEPENDENT_AMBULATORY_CARE_PROVIDER_SITE_OTHER): Payer: PPO | Admitting: Licensed Clinical Social Worker

## 2020-08-06 ENCOUNTER — Other Ambulatory Visit: Payer: Self-pay

## 2020-08-06 DIAGNOSIS — F4323 Adjustment disorder with mixed anxiety and depressed mood: Secondary | ICD-10-CM

## 2020-08-06 DIAGNOSIS — Z634 Disappearance and death of family member: Secondary | ICD-10-CM

## 2020-08-06 NOTE — Progress Notes (Signed)
Virtual Visit via Video Note  I connected with Rebecca Hoffman on 08/06/20 at  1:00 PM EST by a video enabled telemedicine application and verified that I am speaking with the correct person using two identifiers.  Location: Patient: home Provider: [facility]   I discussed the limitations of evaluation and management by telemedicine and the availability of in person appointments. The patient expressed understanding and agreed to proceed.  The patient was advised to call back or seek an in-person evaluation if the symptoms worsen or if the condition fails to improve as anticipated.  I provided 30 minutes of non-face-to-face time during this encounter.   Gyselle Matthew R Janicia Monterrosa, LCSW   THERAPIST PROGRESS NOTE  Session Time: 1:30-2p  Participation Level: Active  Behavioral Response: NeatAlertAnxious  Type of Therapy: Individual Therapy  Treatment Goals addressed: Coping  Interventions: CBT  Summary: Rebecca Hoffman is a 80 y.o. female who presents with symptoms associated with anxiety--worrying; irritability; some issues with sleep quality. Pt reports that overall mood is good but still having anxiety triggered by staff at senior living facility.   Allowed pt to explore and express thoughts and feelings associated with current stressors--pt seemed annoyed at beginning of counseling session because pt had ordered an additional lunch "I don't want to be on the phone in case the delivery driver needs to call me".  Instructed pt that I would give her 30 minutes and call her back. Informed pt that this would shorten overall session by 30 minutes, and pt was okay with this.   Sarrah continues to express negativity towards staff and support staff at the senior living facility. Pt feels that they have wronged her and targeted her by taking her furniture that they states was "too big" for her apartment. Pt stated at last session that she was going to be assertive with her communication and request a meeting  with the manager. Upon follow up, pt states "well the manager never said anything to me about it". Discussed that the plan was for HER to reach out to the manager about the meeting and pt stated  "well, I think I mentioned it to her".  Continuing to use CBT based reframing techniques to replace negative/anxious self talk with positive, realistic, empowering self talk. Continuing to encourage assertive communication skills with staff and support staff.    Suicidal/Homicidal: No  Therapist Response: [name] is continuing to ruminate about facility staff and negative interactions that have happened in the past. Pt is not using problem-solving skills to discuss and work out Agricultural engineer and/or other staff members. This is reflective of intermittent/fluctuating progress. Treatment to continue as indicated.   Plan: Return again in 4 weeks.  Diagnosis: Axis I: Adjustment Disorder with Mixed Emotional Features and Bereavement    Axis II: No diagnosis    [name] [name], LCSW 08/06/2020

## 2020-08-13 ENCOUNTER — Encounter: Payer: Self-pay | Admitting: *Deleted

## 2020-08-13 ENCOUNTER — Encounter: Payer: Self-pay | Admitting: Cardiology

## 2020-08-14 DIAGNOSIS — F321 Major depressive disorder, single episode, moderate: Secondary | ICD-10-CM | POA: Insufficient documentation

## 2020-08-14 DIAGNOSIS — G2581 Restless legs syndrome: Secondary | ICD-10-CM | POA: Insufficient documentation

## 2020-08-14 DIAGNOSIS — G8929 Other chronic pain: Secondary | ICD-10-CM | POA: Insufficient documentation

## 2020-08-14 DIAGNOSIS — M1990 Unspecified osteoarthritis, unspecified site: Secondary | ICD-10-CM | POA: Insufficient documentation

## 2020-08-27 ENCOUNTER — Other Ambulatory Visit: Payer: Self-pay

## 2020-08-27 ENCOUNTER — Ambulatory Visit (INDEPENDENT_AMBULATORY_CARE_PROVIDER_SITE_OTHER): Payer: PPO | Admitting: Licensed Clinical Social Worker

## 2020-08-27 DIAGNOSIS — F4323 Adjustment disorder with mixed anxiety and depressed mood: Secondary | ICD-10-CM | POA: Diagnosis not present

## 2020-08-27 DIAGNOSIS — Z634 Disappearance and death of family member: Secondary | ICD-10-CM

## 2020-08-27 NOTE — Progress Notes (Addendum)
Virtual Visit via Video Note  I connected with Rebecca Hoffman on 08/27/20 at  3:00 PM EST by a video enabled telemedicine application and verified that I am speaking with the correct person using two identifiers.  Location: Patient: home Provider: [facility]   I discussed the limitations of evaluation and management by telemedicine and the availability of in person appointments. The patient expressed understanding and agreed to proceed.  I discussed the assessment and treatment plan with the patient. The patient was provided an opportunity to ask questions and all were answered. The patient agreed with the plan and demonstrated an understanding of the instructions.   The patient was advised to call back or seek an in-person evaluation if the symptoms worsen or if the condition fails to improve as anticipated.  I provided 30 minutes of non-face-to-face time during this encounter.   Deaun Rocha R Jaliel Deavers, LCSW    THERAPIST PROGRESS NOTE  Session Time: 3:00-3:30p  Participation Level: Active  Behavioral Response: Neat and Well GroomedAlertAnxious  Type of Therapy: Individual Therapy  Treatment Goals addressed:   Interventions: CBT, DBT and Supportive  Summary: Rebecca Hoffman is a 80 y.o. female who presents with improving symptoms related to adjustment disorder. Pt reports stable mood and that she is managing stress/anxiety better. Pt reports that she is socializing more with the residents and that the manager "I think she likes me".   Allowed pt to explore and express thoughts and feelings about the facility--overall pt enjoys being there and does not have any complaints other than having her select items of furniture stored at someone else's home for now.  Reviewed pts overall health--pt is feeling great and has few concerns. Reviewed anxiety/mood management strategies and reviewed how to identify triggers.   Encouraged continuing self care, healthy choices, adequate rest, life balance.    Suicidal/Homicidal: No  Therapist Response: Rebecca Hoffman is managing stress and managing anxiety well--which is reflective of overall progress. Treatment to continue as indicated.  Plan: Return again in 6 weeks.  Diagnosis: Axis I: Adjustment Disorder with Mixed Emotional Features and Bereavement    Axis II: No diagnosis    [name] Hazyl Marseille, LCSW 08/27/2020

## 2020-08-27 NOTE — Progress Notes (Unsigned)
Cardiology Office Note:    Date:  08/28/2020   ID:  Rebecca Hoffman, DOB 1940-08-07, MRN [id number]  PCP:  Kendra Opitz, NP she is on amiodarone please do a CMP TSH T4 and T3 free every 6 months Cardiologist:  Norman Herrlich, MD   Referring MD: Audie Pinto, FNP  ASSESSMENT:    1. Longstanding persistent atrial fibrillation (HCC)   2. Chronic anticoagulation   3. On amiodarone therapy   4. Secondary cardiomyopathy (HCC)   5. Chronic combined systolic and diastolic heart failure (HCC)   6. Nonrheumatic mitral valve regurgitation   7. Nonrheumatic tricuspid valve regurgitation    PLAN:    In order of problems listed above:  1. General she is doing well she is in sinus rhythm today continue low-dose amiodarone anticoagulant. 2. Stable improved heart failure compensated continue her low-dose diuretic and guideline directed therapy beta-blocker ARB 3. Valvular regurgitation is functional secondary not severe.  Consider repeat echocardiogram around the time and next visit 4. Stable thyroid disease she is on supplement Synthroid 100 mcg daily and will ask her PCP to be sure to check liver and thyroid every 6 months with amiodarone  Next appointment 6 months   Medication Adjustments/Labs and Tests Ordered: Current medicines are reviewed at length with the patient today.  Concerns regarding medicines are outlined above.  Orders Placed This Encounter  Procedures  . EKG 12-Lead   No orders of the defined types were placed in this encounter.    Chief Complaint  Patient presents with  . Follow-up  . Atrial Fibrillation    History of Present Illness:    Rebecca Hoffman is a 80 y.o. female who is being seen today for the evaluation of atrial fibrillation at the request of Goins, Gwenith Spitz, FNP.  She has a history of paroxysmal atrial fibrillation  as well as hypertension anticoagulated. EKG 12/24/2019 showed atrial fibrillation with a rapid ventricular response age indeterminate  and inferior and anterolateral MI.  She had been admitted to [facility] in June 2021 from urgent care new-onset atrial fibrillation rapid rate. She was treated beta-blockers but rate was controlled is found to be in heart failure and treated with IV diuretic.Echo Cardiogram showed mildly diminished ejection fraction 45 to 50% she was felt to have tachycardia induced cardiomyopathy along with mild right ventricular enlargement mild to moderate tricuspid and mitral regurgitation. TSH was elevated but free T3 and T4 were normal and she was anticoagulated with Eliquis. CTA of the chest showed small pericardial effusion no evidence of pulmonary embolism and minimal right pleural effusion. Laboratory test showed a hemoglobin of 13 6 platelets 208,000 creatinine 0.50 GFR greater than 60 cc proBNP prior to discharge 817 and serial troponins were undetectable.  Overall she is doing better she has had no edema which was severe previously with venous oozing she is not short of breath not aware of any irregular heartbeat and has had no bleeding complication of her anticoagulant.  In my office today she is in sinus rhythm on amiodarone blood pressure is at target and heart failure is compensated.  Recent labs [facility] PCP potassium 4.5 sodium 141 creatinine 0.86 GFR 68 cc cholesterol 167 LDL 102 triglycerides 80 HDL 50 TSH in July was 17.59 hemoglobin 12.2 platelets 160,000  Past Medical History:  Diagnosis Date  . Acquired hypothyroidism 07/26/2020  . Allergic rhinitis 01/24/2020  . Anxiety   . Anxiety and depression 01/24/2020  . Arthritis    "all  over" (04/29/2016)  . Chronic atrial fibrillation with RVR (HCC) 01/24/2020  . Chronic hip pain   . 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 Closed displaced comminuted fracture of shaft of right humerus 04/28/2016  . GERD without esophagitis 01/24/2020  . Hypertension    no longer on meds  . Malaise and  fatigue 07/26/2020  . Moderate major depression (HCC)   . Morbid obesity (HCC)   . Morbid obesity with BMI of 45.0-49.9, adult (HCC) 01/24/2020  . Restless legs   . UTI (urinary tract infection) 04/29/2016   Noted on pre-op urine culture and u/a  . Vitamin D deficiency 04/29/2016    Past Surgical History:  Procedure Laterality Date  . CATARACT EXTRACTION W/ INTRAOCULAR LENS IMPLANT Left 2014  . FRACTURE SURGERY    . ORIF HUMERUS FRACTURE Right 04/28/2016   Procedure: OPEN REDUCTION INTERNAL FIXATION (ORIF) RIGHT PROXIMAL HUMERUS FRACTURE;  Surgeon: Myrene Galas, MD;  Location: [facility] OR;  Service: Orthopedics;  Laterality: Right;    Current Medications: Current Meds  Medication Sig  . acetaminophen (TYLENOL) 500 MG tablet Take 1-2 tablets (500-1,000 mg total) by mouth every 6 (six) hours as needed for mild pain (or Fever >/= 101).  Marland Kitchen amiodarone (PACERONE) 100 MG tablet Take 100 mg by mouth daily.  . calcium citrate (CALCITRATE - DOSED IN MG ELEMENTAL CALCIUM) 950 MG tablet Take 1 tablet (200 mg of elemental calcium total) by mouth 2 (two) times daily.  Marland Kitchen ELIQUIS 5 MG TABS tablet Take 5 mg by mouth 2 (two) times daily.  Marland Kitchen escitalopram (LEXAPRO) 10 MG tablet Take 10 mg by mouth daily.  . furosemide (LASIX) 40 MG tablet Take 40 mg by mouth daily.  Marland Kitchen levothyroxine (SYNTHROID) 100 MCG tablet Take 100 mcg by mouth every morning.  Marland Kitchen losartan (COZAAR) 25 MG tablet Take 25 mg by mouth daily.  . metoprolol succinate (TOPROL-XL) 25 MG 24 hr tablet Take 25 mg by mouth daily.  . montelukast (SINGULAIR) 10 MG tablet Take 10 mg by mouth at bedtime.  . potassium chloride SA (KLOR-CON) 20 MEQ tablet Take 20 mEq by mouth daily.  Marland Kitchen triamcinolone ointment (KENALOG) 0.1 % 3 (three) times daily as needed.     Allergies:   Penicillins   Social History   Socioeconomic History  . Marital status: Divorced    Spouse name: Not on file  . Number of children: Not on file  . Years of education: Not on file   . Highest education level: Not on file  Occupational History  . Not on file  Tobacco Use  . Smoking status: Never Smoker  . Smokeless tobacco: Never Used  Vaping Use  . Vaping Use: Never used  Substance and Sexual Activity  . Alcohol use: No  . Drug use: No  . Sexual activity: Never  Other Topics Concern  . Not on file  Social History Narrative  . Not on file   Social Determinants of Health   Financial Resource Strain: Not on file  Food Insecurity: Not on file  Transportation Needs: Not on file  Physical Activity: Not on file  Stress: Not on file  Social Connections: Not on file     Family History: The patient's family history includes Breast cancer in her sister; Coronary artery disease in her brother and father; Diverticulitis in her mother; Heart attack in her father; Hypertension in her mother.  ROS:   ROS Please see the history of present illness.  All other systems reviewed and are negative.  EKGs/Labs/Other Studies Reviewed:    The following studies were reviewed today:   EKG:  EKG is  ordered today.  The ekg ordered today is personally reviewed and demonstrates sinus rhythm right bundle branch block left anterior hemiblock versus old inferior MI   Physical Exam:    VS:  BP 136/61 (BP Location: Left Arm) Comment (BP Location): lower arm  Pulse 68   Ht 5\' 3"  (1.6 m)   Wt 251 lb (113.9 kg)   SpO2 94%   BMI 44.46 kg/m     Wt Readings from Last 3 Encounters:  08/28/20 251 lb (113.9 kg)  07/26/20 255 lb (115.7 kg)  04/28/16 270 lb (122.5 kg)     GEN:  Well nourished, well developed in no acute distress HEENT: Normal NECK: No JVD; No carotid bruits LYMPHATICS: No lymphadenopathy CARDIAC: RRR, no murmurs, rubs, gallops RESPIRATORY:  Clear to auscultation without rales, wheezing or rhonchi  ABDOMEN: Soft, non-tender, non-distended MUSCULOSKELETAL:  No edema; No deformity  SKIN: Warm and dry NEUROLOGIC:  Alert and oriented x 3 PSYCHIATRIC:   Normal affect     Signed, 04/30/16, MD  08/28/2020 4:43 PM    [facility]

## 2020-08-28 ENCOUNTER — Encounter: Payer: Self-pay | Admitting: Cardiology

## 2020-08-28 ENCOUNTER — Ambulatory Visit: Payer: PPO | Admitting: Cardiology

## 2020-08-28 VITALS — BP 136/61 | HR 68 | Ht 63.0 in | Wt 251.0 lb

## 2020-08-28 DIAGNOSIS — I5042 Chronic combined systolic (congestive) and diastolic (congestive) heart failure: Secondary | ICD-10-CM | POA: Diagnosis not present

## 2020-08-28 DIAGNOSIS — Z7901 Long term (current) use of anticoagulants: Secondary | ICD-10-CM

## 2020-08-28 DIAGNOSIS — I429 Cardiomyopathy, unspecified: Secondary | ICD-10-CM | POA: Diagnosis not present

## 2020-08-28 DIAGNOSIS — I361 Nonrheumatic tricuspid (valve) insufficiency: Secondary | ICD-10-CM | POA: Diagnosis not present

## 2020-08-28 DIAGNOSIS — I340 Nonrheumatic mitral (valve) insufficiency: Secondary | ICD-10-CM | POA: Diagnosis not present

## 2020-08-28 DIAGNOSIS — I480 Paroxysmal atrial fibrillation: Secondary | ICD-10-CM | POA: Diagnosis not present

## 2020-08-28 DIAGNOSIS — Z79899 Other long term (current) drug therapy: Secondary | ICD-10-CM

## 2020-08-28 NOTE — Patient Instructions (Signed)

## 2020-10-11 ENCOUNTER — Ambulatory Visit (INDEPENDENT_AMBULATORY_CARE_PROVIDER_SITE_OTHER): Payer: Self-pay | Admitting: Licensed Clinical Social Worker

## 2020-10-11 ENCOUNTER — Other Ambulatory Visit: Payer: Self-pay

## 2020-10-11 DIAGNOSIS — Z5329 Procedure and treatment not carried out because of patient's decision for other reasons: Secondary | ICD-10-CM

## 2020-10-11 NOTE — Progress Notes (Signed)
LCSW counselor tried to connect with patient for scheduled appointment via MyChart Caregility phone without success. LCSW counselor left message for patient to call office number to reschedule OPT appointment.  Weyman Pedro, MSW, LCSW Outpatient Therapist/Triage Specialist

## 2021-01-09 DIAGNOSIS — L03113 Cellulitis of right upper limb: Secondary | ICD-10-CM | POA: Diagnosis not present

## 2021-01-09 DIAGNOSIS — S61451A Open bite of right hand, initial encounter: Secondary | ICD-10-CM | POA: Diagnosis not present

## 2021-01-29 DIAGNOSIS — E039 Hypothyroidism, unspecified: Secondary | ICD-10-CM | POA: Diagnosis not present

## 2021-01-29 DIAGNOSIS — Z0000 Encounter for general adult medical examination without abnormal findings: Secondary | ICD-10-CM | POA: Diagnosis not present

## 2021-01-29 DIAGNOSIS — J301 Allergic rhinitis due to pollen: Secondary | ICD-10-CM | POA: Diagnosis not present

## 2021-01-29 DIAGNOSIS — M159 Polyosteoarthritis, unspecified: Secondary | ICD-10-CM

## 2021-01-29 DIAGNOSIS — E876 Hypokalemia: Secondary | ICD-10-CM | POA: Insufficient documentation

## 2021-01-29 DIAGNOSIS — F419 Anxiety disorder, unspecified: Secondary | ICD-10-CM | POA: Diagnosis not present

## 2021-01-29 DIAGNOSIS — R5381 Other malaise: Secondary | ICD-10-CM | POA: Diagnosis not present

## 2021-01-29 DIAGNOSIS — F321 Major depressive disorder, single episode, moderate: Secondary | ICD-10-CM | POA: Diagnosis not present

## 2021-01-29 DIAGNOSIS — K219 Gastro-esophageal reflux disease without esophagitis: Secondary | ICD-10-CM | POA: Diagnosis not present

## 2021-01-29 DIAGNOSIS — M150 Primary generalized (osteo)arthritis: Secondary | ICD-10-CM

## 2021-01-29 DIAGNOSIS — Z136 Encounter for screening for cardiovascular disorders: Secondary | ICD-10-CM | POA: Diagnosis not present

## 2021-01-29 DIAGNOSIS — I10 Essential (primary) hypertension: Secondary | ICD-10-CM | POA: Diagnosis not present

## 2021-01-29 DIAGNOSIS — I482 Chronic atrial fibrillation, unspecified: Secondary | ICD-10-CM | POA: Diagnosis not present

## 2021-01-29 DIAGNOSIS — M109 Gout, unspecified: Secondary | ICD-10-CM | POA: Diagnosis not present

## 2021-01-29 DIAGNOSIS — Z6841 Body Mass Index (BMI) 40.0 and over, adult: Secondary | ICD-10-CM | POA: Diagnosis not present

## 2021-01-29 DIAGNOSIS — Z79899 Other long term (current) drug therapy: Secondary | ICD-10-CM | POA: Diagnosis not present

## 2021-01-29 DIAGNOSIS — R5383 Other fatigue: Secondary | ICD-10-CM | POA: Diagnosis not present

## 2021-01-29 HISTORY — DX: Polyosteoarthritis, unspecified: M15.9

## 2021-01-29 HISTORY — DX: Hypokalemia: E87.6

## 2021-01-29 HISTORY — DX: Primary generalized (osteo)arthritis: M15.0

## 2021-04-18 NOTE — Progress Notes (Signed)
Cardiology Office Note:    Date:  04/19/2021   ID:  Rebecca Hoffman, DOB December 28, 1940, MRN [id number]  PCP:  Kendra Opitz, NP  Cardiologist:  Norman Herrlich, MD    Referring MD: Kendra Opitz, NP    ASSESSMENT:    1. Paroxysmal atrial fibrillation (HCC)   2. On amiodarone therapy   3. Chronic anticoagulation   4. Secondary cardiomyopathy (HCC)   5. Chronic combined systolic and diastolic heart failure (HCC)    PLAN:    In order of problems listed above:  She is maintaining sinus rhythm continue low-dose amiodarone labs are being done through her primary care physician's office and she is a stable thyroid supplement. Continue her anticoagulant Stable will reduce her diuretic with urinary incontinence and frequency and refer to urology at her request BP at target continue ARB   Next appointment: 6 months   Medication Adjustments/Labs and Tests Ordered: Current medicines are reviewed at length with the patient today.  Concerns regarding medicines are outlined above.  No orders of the defined types were placed in this encounter.  No orders of the defined types were placed in this encounter.   Chief Complaint  Patient presents with   Atrial Fibrillation    On amiodarone   Cardiomyopathy   Congestive Heart Failure    History of Present Illness:    Rebecca Hoffman is a 80 y.o. female with a hx of paroxysmal atrial fibrillation maintaining sinus rhythm on amiodarone chronic anticoagulation left ventricular dysfunction with a EF 45 to 50% and tachycardia induced cardiomyopathy with heart failure moderate tricuspid and mitral regurgitation small pericardial effusion on CT scan last seen 08/28/2020.  At that time she was maintaining sinus rhythm with her low-dose amiodarone her heart failure was compensated and her valvular regurgitation was functional and clinically not severe.  She is also taking thyroid supplement for hypothyroidism.  Compliance with diet, lifestyle and  medications: Yes Her primary complaint is urinary incontinence and frequency her heart failure is compensated we will reduce her diuretic to 2 days a week and she requests referral to urology. She is more active she can walk a distance without shortness of breath she came in from the parking lot today without trouble and she has had no edema orthopnea chest pain palpitation or syncope. 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/2016   Closed displaced comminuted fracture of shaft of right humerus 04/28/2016   GERD without esophagitis 01/24/2020   Hypertension    no longer on meds   Malaise and fatigue 07/26/2020   Moderate major depression (HCC)    Morbid obesity (HCC)    Morbid obesity with BMI of 45.0-49.9, adult (HCC) 01/24/2020   Restless legs    UTI (urinary tract infection) 04/29/2016   Noted on pre-op urine culture and u/a   Vitamin D deficiency 04/29/2016    Past Surgical History:  Procedure Laterality Date   CATARACT EXTRACTION W/ INTRAOCULAR LENS IMPLANT Left  2014   FRACTURE SURGERY     ORIF HUMERUS FRACTURE Right 04/28/2016   Procedure: OPEN REDUCTION INTERNAL FIXATION (ORIF) RIGHT PROXIMAL HUMERUS FRACTURE;  Surgeon: Myrene Galas, MD;  Location: [facility] OR;  Service: Orthopedics;  Laterality: Right;    Current Medications: Current Meds  Medication Sig   acetaminophen (TYLENOL) 500 MG tablet Take 1-2 tablets (500-1,000 mg total) by mouth  every 6 (six) hours as needed for mild pain (or Fever >/= 101).   amiodarone (PACERONE) 100 MG tablet Take 100 mg by mouth daily.   calcium citrate (CALCITRATE - DOSED IN MG ELEMENTAL CALCIUM) 950 MG tablet Take 1 tablet (200 mg of elemental calcium total) by mouth 2 (two) times daily.   ELIQUIS 5 MG TABS tablet Take 5 mg by mouth 2 (two) times daily.   escitalopram (LEXAPRO) 10 MG tablet Take 10 mg by mouth daily.   furosemide (LASIX) 40 MG tablet Take 40 mg by mouth every other day.   levothyroxine (SYNTHROID) 100 MCG tablet Take 100 mcg by mouth every morning.   losartan (COZAAR) 25 MG tablet Take 25 mg by mouth daily.   metoprolol succinate (TOPROL-XL) 25 MG 24 hr tablet Take 25 mg by mouth daily.   montelukast (SINGULAIR) 10 MG tablet Take 10 mg by mouth at bedtime.   potassium chloride SA (KLOR-CON) 20 MEQ tablet Take 20 mEq by mouth daily.   triamcinolone ointment (KENALOG) 0.1 % Apply 1 application topically 3 (three) times daily as needed (irritation).   [DISCONTINUED] clonazePAM (KLONOPIN) 0.5 MG tablet Take 0.5 mg by mouth at bedtime as needed for anxiety.     Allergies:   Penicillins   Social History   Socioeconomic History   Marital status: Divorced    Spouse name: Not on file   Number of children: Not on file   Years of education: Not on file   Highest education level: Not on file  Occupational History   Not on file  Tobacco Use   Smoking status: Never   Smokeless tobacco: Never  Vaping Use   Vaping Use: Never used  Substance and Sexual Activity   Alcohol use: No   Drug use: No   Sexual activity: Never  Other Topics Concern   Not on file  Social History Narrative   Not on file   Social Determinants of Health   Financial Resource Strain: Not on file  Food Insecurity: Not on file  Transportation Needs: Not on file  Physical Activity: Not on file  Stress: Not on file  Social Connections: Not on file     Family History: The patient's family history  includes Breast cancer in her sister; Coronary artery disease in her brother and father; Diverticulitis in her mother; Heart attack in her father; Hypertension in her mother. ROS:   Please see the history of present illness.    All other systems reviewed and are negative.  EKGs/Labs/Other Studies Reviewed:    The following studies were reviewed today:  EKG:  EKG ordered today and personally reviewed.  The ekg ordered today demonstrates sinus rhythm right bundle branch block left axis deviation first-degree AV block.   Physical Exam:    VS:  BP 140/80 (BP Location: Right Arm, Patient Position: Sitting)   Pulse 69   Ht 5\' 3"  (1.6 m)   Wt 260 lb 12.8 oz (118.3 kg)   SpO2 (!) 89%   BMI 46.20 kg/m     Wt Readings from Last 3 Encounters:  04/19/21 260 lb 12.8  oz (118.3 kg)  08/28/20 251 lb (113.9 kg)  07/26/20 255 lb (115.7 kg)     GEN: Appears her age well nourished, well developed in no acute distress HEENT: Normal NECK: No JVD; No carotid bruits LYMPHATICS: No lymphadenopathy CARDIAC: RRR, no murmurs, rubs, gallops RESPIRATORY:  Clear to auscultation without rales, wheezing or rhonchi  ABDOMEN: Soft, non-tender, non-distended MUSCULOSKELETAL:  No edema; No deformity  SKIN: Warm and dry NEUROLOGIC:  Alert and oriented x 3 PSYCHIATRIC:  Normal affect    Signed, Norman Herrlich, MD  04/19/2021 1:29 PM    [facility]

## 2021-04-19 ENCOUNTER — Other Ambulatory Visit: Payer: Self-pay

## 2021-04-19 ENCOUNTER — Ambulatory Visit: Payer: PPO | Admitting: Cardiology

## 2021-04-19 VITALS — BP 140/80 | HR 69 | Ht 63.0 in | Wt 260.8 lb

## 2021-04-19 DIAGNOSIS — Z7901 Long term (current) use of anticoagulants: Secondary | ICD-10-CM | POA: Diagnosis not present

## 2021-04-19 DIAGNOSIS — I480 Paroxysmal atrial fibrillation: Secondary | ICD-10-CM

## 2021-04-19 DIAGNOSIS — Z79899 Other long term (current) drug therapy: Secondary | ICD-10-CM | POA: Diagnosis not present

## 2021-04-19 DIAGNOSIS — R350 Frequency of micturition: Secondary | ICD-10-CM

## 2021-04-19 DIAGNOSIS — I5042 Chronic combined systolic (congestive) and diastolic (congestive) heart failure: Secondary | ICD-10-CM | POA: Diagnosis not present

## 2021-04-19 DIAGNOSIS — I429 Cardiomyopathy, unspecified: Secondary | ICD-10-CM | POA: Diagnosis not present

## 2021-04-19 NOTE — Patient Instructions (Signed)
Medication Instructions:  Your physician has recommended you make the following change in your medication:  CHANGE: Furosemide take only on Sunday and Thursday *If you need a refill on your cardiac medications before your next appointment, please call your pharmacy*   Lab Work: None If you have labs (blood work) drawn today and your tests are completely normal, you will receive your results only by: MyChart Message (if you have MyChart) OR A paper copy in the mail If you have any lab test that is abnormal or we need to change your treatment, we will call you to review the results.   Testing/Procedures: None   Follow-Up: At [facility], you and your health needs are our priority.  As part of our continuing mission to provide you with exceptional heart care, we have created designated Provider Care Teams.  These Care Teams include your primary Cardiologist (physician) and Advanced Practice Providers (APPs -  Physician Assistants and Nurse Practitioners) who all work together to provide you with the care you need, when you need it.  We recommend signing up for the patient portal called "MyChart".  Sign up information is provided on this After Visit Summary.  MyChart is used to connect with patients for Virtual Visits (Telemedicine).  Patients are able to view lab/test results, encounter notes, upcoming appointments, etc.  Non-urgent messages can be sent to your provider as well.   To learn more about what you can do with MyChart, go to [email or web address].    Your next appointment:   6 month(s)  The format for your next appointment:   In Person  Provider:   Norman Herrlich, MD   Other Instructions

## 2021-05-21 DIAGNOSIS — N3281 Overactive bladder: Secondary | ICD-10-CM | POA: Diagnosis not present

## 2021-05-21 DIAGNOSIS — Z79899 Other long term (current) drug therapy: Secondary | ICD-10-CM | POA: Diagnosis not present

## 2021-05-21 DIAGNOSIS — N952 Postmenopausal atrophic vaginitis: Secondary | ICD-10-CM | POA: Diagnosis not present

## 2021-06-30 DIAGNOSIS — I517 Cardiomegaly: Secondary | ICD-10-CM | POA: Diagnosis not present

## 2021-06-30 DIAGNOSIS — R42 Dizziness and giddiness: Secondary | ICD-10-CM | POA: Diagnosis not present

## 2021-06-30 DIAGNOSIS — K449 Diaphragmatic hernia without obstruction or gangrene: Secondary | ICD-10-CM | POA: Diagnosis not present

## 2021-06-30 DIAGNOSIS — J449 Chronic obstructive pulmonary disease, unspecified: Secondary | ICD-10-CM | POA: Diagnosis not present

## 2021-06-30 DIAGNOSIS — J9811 Atelectasis: Secondary | ICD-10-CM | POA: Diagnosis not present

## 2021-06-30 DIAGNOSIS — U071 COVID-19: Secondary | ICD-10-CM | POA: Diagnosis not present

## 2021-06-30 DIAGNOSIS — R0902 Hypoxemia: Secondary | ICD-10-CM | POA: Diagnosis not present

## 2021-06-30 DIAGNOSIS — I509 Heart failure, unspecified: Secondary | ICD-10-CM | POA: Diagnosis not present

## 2021-07-02 DIAGNOSIS — Z7901 Long term (current) use of anticoagulants: Secondary | ICD-10-CM | POA: Diagnosis not present

## 2021-07-02 DIAGNOSIS — I495 Sick sinus syndrome: Secondary | ICD-10-CM | POA: Diagnosis not present

## 2021-07-02 DIAGNOSIS — I480 Paroxysmal atrial fibrillation: Secondary | ICD-10-CM | POA: Diagnosis not present

## 2021-07-02 DIAGNOSIS — I5042 Chronic combined systolic (congestive) and diastolic (congestive) heart failure: Secondary | ICD-10-CM | POA: Diagnosis not present

## 2021-07-02 DIAGNOSIS — U071 COVID-19: Secondary | ICD-10-CM

## 2021-07-03 DIAGNOSIS — I5042 Chronic combined systolic (congestive) and diastolic (congestive) heart failure: Secondary | ICD-10-CM | POA: Diagnosis not present

## 2021-07-03 DIAGNOSIS — Z7901 Long term (current) use of anticoagulants: Secondary | ICD-10-CM | POA: Diagnosis not present

## 2021-07-03 DIAGNOSIS — I495 Sick sinus syndrome: Secondary | ICD-10-CM | POA: Diagnosis not present

## 2021-07-03 DIAGNOSIS — I480 Paroxysmal atrial fibrillation: Secondary | ICD-10-CM | POA: Diagnosis not present

## 2021-07-04 DIAGNOSIS — Z7901 Long term (current) use of anticoagulants: Secondary | ICD-10-CM | POA: Diagnosis not present

## 2021-07-04 DIAGNOSIS — I5042 Chronic combined systolic (congestive) and diastolic (congestive) heart failure: Secondary | ICD-10-CM | POA: Diagnosis not present

## 2021-07-04 DIAGNOSIS — I495 Sick sinus syndrome: Secondary | ICD-10-CM | POA: Diagnosis not present

## 2021-07-04 DIAGNOSIS — I480 Paroxysmal atrial fibrillation: Secondary | ICD-10-CM | POA: Diagnosis not present

## 2021-09-11 ENCOUNTER — Encounter: Payer: Self-pay | Admitting: Cardiology

## 2021-09-11 ENCOUNTER — Ambulatory Visit: Payer: PPO | Admitting: Cardiology

## 2021-09-11 ENCOUNTER — Other Ambulatory Visit: Payer: Self-pay

## 2021-09-11 VITALS — BP 142/75 | HR 87 | Ht 63.0 in | Wt 252.0 lb

## 2021-09-11 DIAGNOSIS — Z79899 Other long term (current) drug therapy: Secondary | ICD-10-CM | POA: Diagnosis not present

## 2021-09-11 DIAGNOSIS — I4510 Unspecified right bundle-branch block: Secondary | ICD-10-CM

## 2021-09-11 DIAGNOSIS — R7989 Other specified abnormal findings of blood chemistry: Secondary | ICD-10-CM

## 2021-09-11 DIAGNOSIS — E039 Hypothyroidism, unspecified: Secondary | ICD-10-CM

## 2021-09-11 DIAGNOSIS — I480 Paroxysmal atrial fibrillation: Secondary | ICD-10-CM

## 2021-09-11 DIAGNOSIS — Z7901 Long term (current) use of anticoagulants: Secondary | ICD-10-CM

## 2021-09-11 DIAGNOSIS — I5042 Chronic combined systolic (congestive) and diastolic (congestive) heart failure: Secondary | ICD-10-CM | POA: Diagnosis not present

## 2021-09-11 DIAGNOSIS — J40 Bronchitis, not specified as acute or chronic: Secondary | ICD-10-CM

## 2021-09-11 DIAGNOSIS — I340 Nonrheumatic mitral (valve) insufficiency: Secondary | ICD-10-CM

## 2021-09-11 MED ORDER — PREDNISONE 20 MG PO TABS: 20.0000 mg | ORAL_TABLET | Freq: Every day | ORAL | 0 refills | Status: DC

## 2021-09-11 MED ORDER — AMOXICILLIN-POT CLAVULANATE 875-125 MG PO TABS: 1.0000 | ORAL_TABLET | Freq: Two times a day (BID) | ORAL | 0 refills | Status: DC

## 2021-09-11 NOTE — Progress Notes (Signed)
?Cardiology Office Note:   ? ?Date:  09/11/2021  ? ?ID:  Rebecca Hoffman, DOB 12-22-1940, MRN [id number] ? ?PCP:  Audie Pinto, FNP  ?Cardiologist:  Norman Herrlich, MD   ? ?Referring MD: Audie Pinto, FNP  ? ? ?ASSESSMENT:   ? ?1. Paroxysmal atrial fibrillation (HCC)   ?2. On amiodarone therapy   ?3. Chronic anticoagulation   ?4. Chronic combined systolic and diastolic heart failure (HCC)   ?5. Nonrheumatic mitral valve regurgitation   ?6. Acquired hypothyroidism   ?7. Elevated brain natriuretic peptide (BNP) level   ?8. RBBB   ?9. Bronchitis   ? ?PLAN:   ? ?In order of problems listed above: ? ?From a cardiology perspective stable maintaining sinus rhythm on low-dose amiodarone and her anticoagulant continue the same ?Heart failure appears to be compensated continue her loop diuretic beta-blocker ARB.  We will also check BMP and proBNP ?Stable on thyroid supplement ?Clinical problem today appears to be bronchitis purulent sputum although it is outside the scope of my usual practice I will give her a short course of prednisone that she is taken in the past Augmentin with purulent sputum check a chest x-ray at her request we will also do COVID and influenza testing.  She will follow-up with her PCP for this respiratory issue chest x-ray also help to define if she has any evidence of amiodarone lung toxicity.  Her presentation does not suggest that. ? ? ?Next appointment: 3 months ? ? ?Medication Adjustments/Labs and Tests Ordered: ?Current medicines are reviewed at length with the patient today.  Concerns regarding medicines are outlined above.  ?No orders of the defined types were placed in this encounter. ? ?No orders of the defined types were placed in this encounter. ? ? ?Chief Complaint  ?Patient presents with  ? Atrial Fibrillation  ? Hospitalization Follow-up  ? ? ?History of Present Illness:   ? ?Rebecca Hoffman is a 81 y.o. 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  Seen by me at [facility] in December with COVID-19 infection elevated proBNP level greater than 2000 maintaining sinus rhythm and had 1 APC less than 2-second pause.  Her EKG showed right bundle branch block troponin was undetectable and was admitted to the hospital with hypoxemia saturation of 84% on room air.  ? ?Compliance with diet, lifestyle and medications: Yes ? ?Unfortunately she has been ill for 2 weeks she has a purulent cough green sputum she is short of breath wheezing and feels quite sick she has had no fevers she is on 4 COVID test at home all negative and unfortunately could not get into see her family physician.  She is scheduled to see me today regarding her atrial fibrillation.  No edema orthopnea chest pain or syncope. ? ?Labs [facility] PCP 08/01/2021: ?CMP normal potassium 4.0 creatinine 1.11 GFR 50 cc normal liver function test ?Cholesterol 202 LDL 137 triglycerides 97 HDL 54 ?TSH normal 2.41 ?Past Medical History:  ?Diagnosis Date  ? Acquired hypothyroidism 07/26/2020  ? Allergic rhinitis 01/24/2020  ? Anxiety   ? Anxiety and depression 01/24/2020  ? Arthritis   ? "all over" (04/29/2016)  ? Chronic atrial fibrillation with RVR (HCC) 01/24/2020  ? Chronic hip pain   ? 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 Closed displaced comminuted fracture of shaft of right humerus 04/28/2016  ? GERD without esophagitis 01/24/2020  ? Hypertension   ? no longer on meds  ? Malaise and fatigue 07/26/2020  ? Moderate major depression (HCC)   ? Morbid obesity (HCC)   ? Morbid obesity with BMI of 45.0-49.9, adult (HCC) 01/24/2020  ? Restless legs   ? UTI (urinary tract infection) 04/29/2016  ? Noted on pre-op urine culture and u/a  ? Vitamin D deficiency  04/29/2016  ? ? ?Past Surgical History:  ?Procedure Laterality Date  ? CATARACT EXTRACTION W/ INTRAOCULAR LENS IMPLANT Left 2014  ? FRACTURE SURGERY    ? ORIF HUMERUS FRACTURE Right 04/28/2016  ? Procedure: OPEN REDUCTION INTERNAL FIXATION (ORIF) RIGHT PROXIMAL HUMERUS FRACTURE;  Surgeon: Myrene Galas, MD;  Location: [facility] OR;  Service: Orthopedics;  Laterality: Right;  ? ? ?Current Medications: ?Current Meds  ?Medication Sig  ? acetaminophen (TYLENOL) 500 MG tablet Take 1-2 tablets (500-1,000 mg total) by mouth every 6 (six) hours as needed for mild pain (or Fever >/= 101).  ? albuterol (VENTOLIN HFA) 108 (90 Base) MCG/ACT inhaler Inhale 2 puffs into the lungs every 4 (four) hours as needed for shortness of breath.  ? amiodarone (PACERONE) 100 MG tablet Take 100 mg by mouth daily.  ? amoxicillin (AMOXIL) 500 MG tablet Take 500 mg by mouth 2 (two) times daily.  ? calcium citrate (CALCITRATE - DOSED IN MG ELEMENTAL CALCIUM) 950 MG tablet Take 1 tablet (200 mg of elemental calcium total) by mouth 2 (two) times daily.  ? ELIQUIS 5 MG TABS tablet Take 5 mg by mouth 2 (two) times daily.  ? escitalopram (LEXAPRO) 10 MG tablet Take 10 mg by mouth daily.  ? furosemide (LASIX) 40 MG tablet Take 40 mg by mouth 2 (two) times a week. Take on Sunday and Thursday  ? levothyroxine (SYNTHROID) 100 MCG tablet Take 100 mcg by mouth every morning.  ? losartan (COZAAR) 25 MG tablet Take 25 mg by mouth daily.  ? metoprolol succinate (TOPROL-XL) 25 MG 24 hr tablet Take 25 mg by mouth daily.  ? montelukast (SINGULAIR) 10 MG tablet Take 10 mg by mouth at bedtime.  ? potassium chloride SA (KLOR-CON) 20 MEQ tablet Take 20 mEq by mouth daily.  ? triamcinolone ointment (KENALOG) 0.1 % Apply 1 application topically 3 (three) times daily as needed (irritation).  ?  ? ?Allergies:   Penicillins  ? ?Social History  ? ?Socioeconomic History  ? Marital status: Divorced  ?  Spouse name: Not on file  ? Number of children: Not on file  ? Years of  education: Not on file  ? Highest education level: Not on file  ?Occupational History  ? Not on file  ?Tobacco Use  ? Smoking status: Never  ? Smokeless tobacco: Never  ?Vaping Use  ? Vaping Use: Never used  ?Substance and Sexual Activity  ? Alcohol use: No  ? Drug use: No  ? Sexual activity: Never  ?Other Topics Concern  ? Not on file  ?Social History Narrative  ? Not on file  ? ?Social Determinants of Health  ? ?Financial Resource Strain: Not on file  ?Food Insecurity: Not on file  ?Transportation Needs: Not on file  ?Physical Activity: Not on file  ?Stress: Not on file  ?Social Connections: Not on file  ?  ? ?Family History: ?The patient's family history includes Breast cancer in her sister; Coronary artery disease in her brother and father; Diverticulitis  in her mother; Heart attack in her father; Hypertension in her mother. ?ROS:   ?Please see the history of present illness.    ?All other systems reviewed and are negative. ? ?EKGs/Labs/Other Studies Reviewed:   ? ?The following studies were reviewed today: ? ?EKG:  EKG ordered today and personally reviewed.  The ekg ordered today demonstrates sinus rhythm right bundle branch block left anterior hemiblock ? ? ? ?Physical Exam:   ? ?VS:  BP (!) 142/75 (BP Location: Right Arm, Patient Position: Sitting, Cuff Size: Normal)   Pulse 87   Ht 5\' 3"  (1.6 m)   Wt 252 lb (114.3 kg)   SpO2 (!) 89%   BMI 44.64 kg/m?    ? ?Wt Readings from Last 3 Encounters:  ?09/11/21 252 lb (114.3 kg)  ?04/19/21 260 lb 12.8 oz (118.3 kg)  ?08/28/20 251 lb (113.9 kg)  ?  ? ?GEN: She does not look acutely ill well nourished, well developed in no acute distress ?HEENT: Normal ?NECK: No JVD; No carotid bruits ?LYMPHATICS: No lymphadenopathy ?CARDIAC: RRR, no murmurs, rubs, gallops ?RESPIRATORY: She has diffuse wheezing and rhonchi bilaterally ?ABDOMEN: Soft, non-tender, non-distended ?MUSCULOSKELETAL:  No edema; No deformity  ?SKIN: Warm and dry ?NEUROLOGIC:  Alert and oriented x  3 ?PSYCHIATRIC:  Normal affect  ? ? ?Signed, ?Norman Herrlich, MD  ?09/11/2021 3:07 PM    ?[facility]  ?

## 2021-09-11 NOTE — Patient Instructions (Addendum)
Medication Instructions:  ? ?FOR 10 DAYS ONLY TAKE  AUGMENTIN 87.5-12.5 MG TWICE A DAYS ? ?FOR 10 DAYS ONLY TAKE  PREDNISONE 20 MG TABLETS AS INSTRUCTED BELOW  ? ? ?*If you need a refill on your cardiac medications before your next appointment, please call your pharmacy* ? ? ?Lab Work: PRO BNP AT [facility]  ? ? ?If you have labs (blood work) drawn today and your tests are completely normal, you will receive your results only by: ?MyChart Message (if you have MyChart) OR ?A paper copy in the mail ?If you have any lab test that is abnormal or we need to change your treatment, we will call you to review the results. ? ? ?Testing/Procedures:  AT [facility]  A chest x-ray takes a picture of the organs and structures inside the chest, including the heart, lungs, and blood vessels. This test can show several things, including, whether the heart is enlarges; whether fluid is building up in the lungs; and whether pacemaker / defibrillator leads are still in place. ? ? ? ? ?Follow-Up: ?At [facility], you and your health needs are our priority.  As part of our continuing mission to provide you with exceptional heart care, we have created designated Provider Care Teams.  These Care Teams include your primary Cardiologist (physician) and Advanced Practice Providers (APPs -  Physician Assistants and Nurse Practitioners) who all work together to provide you with the care you need, when you need it. ? ?We recommend signing up for the patient portal called "MyChart".  Sign up information is provided on this After Visit Summary.  MyChart is used to connect with patients for Virtual Visits (Telemedicine).  Patients are able to view lab/test results, encounter notes, upcoming appointments, etc.  Non-urgent messages can be sent to your provider as well.   ?To learn more about what you can do with MyChart, go to [email or web address].   ? ?Your next appointment:   ?3 month(s) ? ?The format for your next appointment:    ?In Person ? ?Provider:   ?Norman Herrlich, MD  ? ? ?Other Instructions ? ?

## 2021-09-11 NOTE — Addendum Note (Signed)
Addended by: Oleta Mouse on: 09/11/2021 04:15 PM ? ? Modules accepted: Orders ? ?

## 2021-09-12 ENCOUNTER — Telehealth: Payer: Self-pay | Admitting: Cardiology

## 2021-09-12 NOTE — Addendum Note (Signed)
Addended by: Jerl Santos R on: 09/12/2021 04:46 PM ? ? Modules accepted: Orders ? ?

## 2021-09-12 NOTE — Telephone Encounter (Signed)
Attempted to call patient. Patient did not answer and the voice mailbox was full. ?

## 2021-09-12 NOTE — Telephone Encounter (Signed)
Hospital called to say that they dont do covid test there. Patient would have to go to another location for that. Please advise ?

## 2021-09-13 MED ORDER — FUROSEMIDE 40 MG PO TABS: 40.0000 mg | ORAL_TABLET | ORAL | 3 refills | Status: DC

## 2021-09-13 NOTE — Telephone Encounter (Signed)
Called patient regarding her need for a Covid test. Patient stated she had already started on steroids and she wasn't sure if she needed a covid test at this time. I explained that if she needed a covid test she could have that test performed at a [facility] or [facility] drugstore. Patient was agreeable with that recommendation and had no further questions at this time. ?

## 2021-09-13 NOTE — Telephone Encounter (Signed)
Attempted to call patient. Patient did not answer and the voice mailbox was full. ?

## 2021-09-17 ENCOUNTER — Telehealth: Payer: Self-pay | Admitting: *Deleted

## 2021-09-17 NOTE — Telephone Encounter (Signed)
Let pt know her blood work was normal and CXR showed some bronchitis but no evidence of heart failure. Pt is still on steroids and antibiotic and is feeling better.  ?

## 2021-09-17 NOTE — Telephone Encounter (Signed)
-----   Message from Baldo Daub, MD sent at 09/17/2021 12:51 PM EST ----- ?Received from [facility] health BNP level was normal chest x-ray showed findings of bronchitis no evidence of heart failure ? ?

## 2021-09-23 ENCOUNTER — Encounter: Payer: Self-pay | Admitting: Cardiology

## 2021-09-23 DIAGNOSIS — I5042 Chronic combined systolic (congestive) and diastolic (congestive) heart failure: Secondary | ICD-10-CM

## 2021-09-24 MED ORDER — POTASSIUM CHLORIDE CRYS ER 20 MEQ PO TBCR: 20.0000 meq | EXTENDED_RELEASE_TABLET | Freq: Every day | ORAL | 3 refills | Status: AC

## 2021-09-24 MED ORDER — POTASSIUM CHLORIDE CRYS ER 20 MEQ PO TBCR: 20.0000 meq | EXTENDED_RELEASE_TABLET | Freq: Every day | ORAL | 3 refills | Status: DC

## 2021-09-24 NOTE — Addendum Note (Signed)
Addended by: Truddie Hidden on: 09/24/2021 07:22 AM ? ? Modules accepted: Orders ? ?

## 2021-09-24 NOTE — Addendum Note (Signed)
Addended by: Truddie Hidden on: 09/24/2021 07:24 AM ? ? Modules accepted: Orders ? ?

## 2021-10-03 ENCOUNTER — Other Ambulatory Visit: Payer: Self-pay | Admitting: Cardiology

## 2021-10-08 ENCOUNTER — Telehealth: Payer: Self-pay | Admitting: Cardiology

## 2021-10-08 NOTE — Telephone Encounter (Signed)
I called and spoke with the patient. ?She states she is having issues with pain in her leg/ back possibly related to sciatica. ?I have advised her that unfortunately this will need to be followed/ managed by her PCP. ?I have suggested if she cannot get in with her PCP in a timely manner, then she should follow up with Urgent Care.  ? ?The patient voices understanding and is agreeable. ? ?

## 2021-10-08 NOTE — Telephone Encounter (Signed)
Patient calling to see is Dr. Dulce Sellar would be willing to see her for her sciatic. She says she cannot get in contact with her PCP. ?

## 2021-12-18 ENCOUNTER — Ambulatory Visit: Payer: PPO | Admitting: Cardiology

## 2021-12-18 ENCOUNTER — Encounter: Payer: Self-pay | Admitting: Cardiology

## 2021-12-18 VITALS — BP 142/59 | HR 78 | Ht 63.0 in | Wt 254.8 lb

## 2021-12-18 DIAGNOSIS — I5042 Chronic combined systolic (congestive) and diastolic (congestive) heart failure: Secondary | ICD-10-CM | POA: Diagnosis not present

## 2021-12-18 DIAGNOSIS — I480 Paroxysmal atrial fibrillation: Secondary | ICD-10-CM | POA: Diagnosis not present

## 2021-12-18 DIAGNOSIS — Z7901 Long term (current) use of anticoagulants: Secondary | ICD-10-CM | POA: Diagnosis not present

## 2021-12-18 DIAGNOSIS — I340 Nonrheumatic mitral (valve) insufficiency: Secondary | ICD-10-CM

## 2021-12-18 DIAGNOSIS — Z79899 Other long term (current) drug therapy: Secondary | ICD-10-CM

## 2021-12-18 DIAGNOSIS — I4510 Unspecified right bundle-branch block: Secondary | ICD-10-CM

## 2021-12-18 NOTE — Patient Instructions (Signed)
Medication Instructions:  Your physician has recommended you make the following change in your medication:  Stop Eliquis for 3 days (72 Hours)  *If you need a refill on your cardiac medications before your next appointment, please call your pharmacy*   Lab Work: Your physician recommends that you return for lab work in: Today for TSH, T3, T4 Free, CMP, CBC and Lipid Panel  If you have labs (blood work) drawn today and your tests are completely normal, you will receive your results only by: MyChart Message (if you have MyChart) OR A paper copy in the mail If you have any lab test that is abnormal or we need to change your treatment, we will call you to review the results.   Testing/Procedures: NONE   Follow-Up: At [facility], you and your health needs are our priority.  As part of our continuing mission to provide you with exceptional heart care, we have created designated Provider Care Teams.  These Care Teams include your primary Cardiologist (physician) and Advanced Practice Providers (APPs -  Physician Assistants and Nurse Practitioners) who all work together to provide you with the care you need, when you need it.  We recommend signing up for the patient portal called "MyChart".  Sign up information is provided on this After Visit Summary.  MyChart is used to connect with patients for Virtual Visits (Telemedicine).  Patients are able to view lab/test results, encounter notes, upcoming appointments, etc.  Non-urgent messages can be sent to your provider as well.   To learn more about what you can do with MyChart, go to [email or web address].    Your next appointment:   4 month(s)  The format for your next appointment:   In Person  Provider:   Norman Herrlich, MD    Other Instructions   Important Information About Sugar

## 2021-12-18 NOTE — Progress Notes (Signed)
Cardiology Office Note:    Date:  12/18/2021   ID:  Rebecca Hoffman, DOB 1940-12-02, MRN [id number]  PCP:  Audie PintoGoins, Karen C, FNP  Cardiologist:  Norman HerrlichBrian Terrisa Curfman, MD    Referring MD: Audie PintoGoins, Karen C, FNP    ASSESSMENT:    1. Paroxysmal atrial fibrillation (HCC)   2. On amiodarone therapy   3. Chronic anticoagulation   4. Chronic combined systolic and diastolic heart failure (HCC)   5. Nonrheumatic mitral valve regurgitation   6. RBBB    PLAN:    In order of problems listed above:  She maintains sinus rhythm continue low-dose amiodarone and her anticoagulant not been to give her 3-day break with this extensive hematoma to allow for stability check thyroid liver function for toxicity. Heart failure is nicely compensated continue current loop diuretic and beta-blocker Stable EKG pattern right bundle branch block With such an extensive hematoma also check a CBC and for hyperlipidemia lipid profile   Next appointment: 3 months   Medication Adjustments/Labs and Tests Ordered: Current medicines are reviewed at length with the patient today.  Concerns regarding medicines are outlined above.  No orders of the defined types were placed in this encounter.  No orders of the defined types were placed in this encounter.   Chief Complaint  Patient presents with   Follow-up   Atrial Fibrillation    History of Present Illness:    Rebecca Hoffman is a 81 y.o. female with a hx of paroxysmal atrial fibrillation maintaining sinus rhythm on amiodarone chronic anticoagulation tachycardia induced cardiomyopathy right bundle branch block heart failure combined systolic diastolic and mitral regurgitation last seen 09/11/2021 acutely ill with bronchitis.  Compliance with diet, lifestyle and medications: Yes  Her daughter is present participates in evaluation decision making 6 days ago she fell she [facility] had [facility]-rays and has a very large hematoma in the left calf. She did not have  lab work She uses a walker and she missed her step getting out of the car She would like to go to physical therapy for strengthening and gait training She quickly recovered after treated bronchitis last visit No edema shortness of breath chest pain palpitation. 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/30/2016   Closed displaced comminuted fracture of shaft of right humerus 04/28/2016   GERD without esophagitis 01/24/2020   Hypertension    no longer on meds   Malaise and fatigue 07/26/2020   Moderate major depression (HCC)    Morbid obesity (HCC)    Morbid obesity with BMI of 45.0-49.9, adult (HCC) 01/24/2020   Restless legs    UTI (urinary tract infection) 04/29/2016   Noted on pre-op urine culture and u/a   Vitamin D deficiency 04/29/2016    Past Surgical History:  Procedure Laterality Date   CATARACT EXTRACTION W/ INTRAOCULAR LENS IMPLANT Left 2014   FRACTURE SURGERY     ORIF HUMERUS FRACTURE Right 04/28/2016   Procedure: OPEN REDUCTION INTERNAL FIXATION (ORIF) RIGHT PROXIMAL HUMERUS FRACTURE;  Surgeon: Myrene GalasMichael Handy, MD;  Location: [facility] OR;  Service: Orthopedics;  Laterality: Right;    Current Medications: Current  Meds  Medication Sig   acetaminophen (TYLENOL) 500 MG tablet Take 1-2 tablets (500-1,000 mg total) by mouth every 6 (six) hours as needed for mild pain (or Fever >/= 101).   albuterol (VENTOLIN HFA) 108 (90 Base) MCG/ACT inhaler Inhale 2 puffs into the lungs every 4 (four) hours as needed for shortness of breath.   amiodarone (PACERONE) 100 MG tablet Take 100 mg by mouth daily.   amoxicillin (AMOXIL) 500 MG tablet Take 500 mg by mouth 2  (two) times daily.   amoxicillin-clavulanate (AUGMENTIN) 875-125 MG tablet Take 1 tablet by mouth 2 (two) times daily.   atorvastatin (LIPITOR) 40 MG tablet Take 40 mg by mouth at bedtime.   calcium citrate (CALCITRATE - DOSED IN MG ELEMENTAL CALCIUM) 950 MG tablet Take 1 tablet (200 mg of elemental calcium total) by mouth 2 (two) times daily.   ELIQUIS 5 MG TABS tablet Take 5 mg by mouth 2 (two) times daily.   escitalopram (LEXAPRO) 10 MG tablet Take 10 mg by mouth daily.   furosemide (LASIX) 40 MG tablet Take 1 tablet (40 mg total) by mouth 2 (two) times a week. Take on Sunday and Thursday   levothyroxine (SYNTHROID) 100 MCG tablet Take 100 mcg by mouth every morning.   losartan (COZAAR) 25 MG tablet Take 25 mg by mouth daily.   metoprolol succinate (TOPROL-XL) 25 MG 24 hr tablet Take 25 mg by mouth daily.   montelukast (SINGULAIR) 10 MG tablet Take 10 mg by mouth at bedtime.   potassium chloride SA (KLOR-CON M) 20 MEQ tablet Take 1 tablet (20 mEq total) by mouth daily.   predniSONE (DELTASONE) 20 MG tablet Take 1 tablet (20 mg total) by mouth daily with breakfast. TAKE 60 MG TAKE 40 MG TAKE 40 MG TAKE 60 MG  TAKE 20 MG  TAKE 60 MG TAKE 10 MG TAKE 10 MG   traMADol (ULTRAM) 50 MG tablet Take 50 mg by mouth every 6 (six) hours as needed for pain.   triamcinolone ointment (KENALOG) 0.1 % Apply 1 application topically 3 (three) times daily as needed (irritation).     Allergies:   Penicillins   Social History   Socioeconomic History   Marital status: Divorced    Spouse name: Not on file   Number of children: Not on file   Years of education: Not on file   Highest education level: Not on file  Occupational History   Not on file  Tobacco Use   Smoking status: Never   Smokeless tobacco: Never  Vaping Use   Vaping Use: Never used  Substance and Sexual Activity   Alcohol use: No   Drug use: No   Sexual activity: Never  Other Topics Concern   Not on file  Social History Narrative    Not on file   Social Determinants of Health   Financial Resource Strain: Not on file  Food Insecurity: Not on file  Transportation Needs: Not on file  Physical Activity: Not on file  Stress: Not on file  Social Connections: Not on file     Family History: The patient's family history includes Breast cancer in her sister; Coronary artery disease in her brother and father; Diverticulitis in her mother; Heart attack in her father; Hypertension in her mother. ROS:   Please see the history of present illness.    All other systems reviewed and are negative.  EKGs/Labs/Other Studies Reviewed:    The following studies were reviewed today:  EKG:  EKG ordered today  and personally reviewed.  The ekg ordered today demonstrates sinus rhythm right bundle branch block first-degree AV block   Physical Exam:    VS:  BP (!) 142/59 (BP Location: Left Wrist, Patient Position: Sitting)   Pulse 78   Ht 5\' 3"  (1.6 m)   Wt 254 lb 12.8 oz (115.6 kg)   SpO2 95%   BMI 45.14 kg/m     Wt Readings from Last 3 Encounters:  12/18/21 254 lb 12.8 oz (115.6 kg)  09/11/21 252 lb (114.3 kg)  04/19/21 260 lb 12.8 oz (118.3 kg)     GEN:  Well nourished, well developed in no acute distress HEENT: Normal NECK: No JVD; No carotid bruits LYMPHATICS: No lymphadenopathy CARDIAC: RRR, no murmurs, rubs, gallops RESPIRATORY:  Clear to auscultation without rales, wheezing or rhonchi  ABDOMEN: Soft, non-tender, non-distended MUSCULOSKELETAL:  No edema; No deformity.  Extensive ecchymosis left lower extremity medial calf laterally SKIN: Warm and dry NEUROLOGIC:  Alert and oriented x 3 PSYCHIATRIC:  Normal affect    Signed, 06/19/21, MD  12/18/2021 3:34 PM    [facility]

## 2021-12-19 LAB — COMPREHENSIVE METABOLIC PANEL
ALT: 42 IU/L — ABNORMAL HIGH (ref 0–32)
AST: 29 IU/L (ref 0–40)
Albumin/Globulin Ratio: 1.6 (ref 1.2–2.2)
Albumin: 4.5 g/dL (ref 3.6–4.6)
Alkaline Phosphatase: 144 IU/L — ABNORMAL HIGH (ref 44–121)
BUN/Creatinine Ratio: 29 — ABNORMAL HIGH (ref 12–28)
BUN: 22 mg/dL (ref 8–27)
Bilirubin Total: 0.4 mg/dL (ref 0.0–1.2)
CO2: 24 mmol/L (ref 20–29)
Calcium: 9.5 mg/dL (ref 8.7–10.3)
Chloride: 100 mmol/L (ref 96–106)
Creatinine, Ser: 0.75 mg/dL (ref 0.57–1.00)
Globulin, Total: 2.8 g/dL (ref 1.5–4.5)
Glucose: 93 mg/dL (ref 70–99)
Potassium: 4.7 mmol/L (ref 3.5–5.2)
Sodium: 142 mmol/L (ref 134–144)
Total Protein: 7.3 g/dL (ref 6.0–8.5)
eGFR: 79 mL/min/{1.73_m2} (ref >59)

## 2021-12-19 LAB — LIPID PANEL
Chol/HDL Ratio: 2.1 ratio (ref 0.0–4.4)
Cholesterol, Total: 140 mg/dL (ref 100–199)
HDL: 67 mg/dL (ref >39)
LDL Chol Calc (NIH): 61 mg/dL (ref 0–99)
Triglycerides: 58 mg/dL (ref 0–149)
VLDL Cholesterol Cal: 12 mg/dL (ref 5–40)

## 2021-12-19 LAB — CBC WITH DIFFERENTIAL/PLATELET
Basophils Absolute: 0 10*3/uL (ref 0.0–0.2)
Basos: 0 %
EOS (ABSOLUTE): 0.2 10*3/uL (ref 0.0–0.4)
Eos: 2 %
Hematocrit: 42.6 % (ref 34.0–46.6)
Hemoglobin: 13.9 g/dL (ref 11.1–15.9)
Immature Grans (Abs): 0 10*3/uL (ref 0.0–0.1)
Immature Granulocytes: 0 %
Lymphocytes Absolute: 1.8 10*3/uL (ref 0.7–3.1)
Lymphs: 19 %
MCH: 29.8 pg (ref 26.6–33.0)
MCHC: 32.6 g/dL (ref 31.5–35.7)
MCV: 91 fL (ref 79–97)
Monocytes Absolute: 1.3 10*3/uL — ABNORMAL HIGH (ref 0.1–0.9)
Monocytes: 13 %
Neutrophils Absolute: 6.3 10*3/uL (ref 1.4–7.0)
Neutrophils: 66 %
Platelets: 216 10*3/uL (ref 150–450)
RBC: 4.67 x10E6/uL (ref 3.77–5.28)
RDW: 12.6 % (ref 11.7–15.4)
WBC: 9.6 10*3/uL (ref 3.4–10.8)

## 2021-12-19 LAB — T3, FREE: T3, Free: 1.9 pg/mL — ABNORMAL LOW (ref 2.0–4.4)

## 2021-12-19 LAB — T4, FREE: Free T4: 1.87 ng/dL — ABNORMAL HIGH (ref 0.82–1.77)

## 2021-12-19 LAB — TSH: TSH: 1.89 u[IU]/mL (ref 0.450–4.500)

## 2022-01-30 ENCOUNTER — Telehealth: Payer: Self-pay | Admitting: Cardiology

## 2022-01-30 NOTE — Telephone Encounter (Signed)
Pt called stating that she was being discharged from [facility] this afternoon and does not feel she can go home safely. Called Discharge Planner Olena Mater and left a message asking if she could speak with Rebecca Hoffman today about her concerns.

## 2022-01-30 NOTE — Telephone Encounter (Signed)
Pt states that she is currently in the hospital and would like for nurse to callback. Please advise

## 2022-03-19 DIAGNOSIS — R600 Localized edema: Secondary | ICD-10-CM | POA: Insufficient documentation

## 2022-03-19 DIAGNOSIS — R748 Abnormal levels of other serum enzymes: Secondary | ICD-10-CM

## 2022-03-19 DIAGNOSIS — Z9181 History of falling: Secondary | ICD-10-CM | POA: Insufficient documentation

## 2022-03-19 DIAGNOSIS — R7303 Prediabetes: Secondary | ICD-10-CM | POA: Insufficient documentation

## 2022-03-19 DIAGNOSIS — E782 Mixed hyperlipidemia: Secondary | ICD-10-CM | POA: Insufficient documentation

## 2022-03-19 DIAGNOSIS — I5042 Chronic combined systolic (congestive) and diastolic (congestive) heart failure: Secondary | ICD-10-CM

## 2022-03-19 DIAGNOSIS — D500 Iron deficiency anemia secondary to blood loss (chronic): Secondary | ICD-10-CM

## 2022-03-19 HISTORY — DX: History of falling: Z91.81

## 2022-03-19 HISTORY — DX: Localized edema: R60.0

## 2022-03-19 HISTORY — DX: Abnormal levels of other serum enzymes: R74.8

## 2022-03-19 HISTORY — DX: Chronic combined systolic (congestive) and diastolic (congestive) heart failure: I50.42

## 2022-03-19 HISTORY — DX: Iron deficiency anemia secondary to blood loss (chronic): D50.0

## 2022-03-19 HISTORY — DX: Prediabetes: R73.03

## 2022-03-19 HISTORY — DX: Mixed hyperlipidemia: E78.2

## 2022-03-22 DIAGNOSIS — E538 Deficiency of other specified B group vitamins: Secondary | ICD-10-CM

## 2022-03-22 HISTORY — DX: Deficiency of other specified B group vitamins: E53.8

## 2022-04-01 NOTE — Progress Notes (Unsigned)
Cardiology Office Note:    Date:  04/02/2022   ID:  [name], DOB 1940/11/21, MRN [id number]  PCP:  Audie Pinto, FNP  Cardiologist:  Norman Herrlich, MD    Referring MD: Audie Pinto, FNP    ASSESSMENT:    1. Paroxysmal atrial fibrillation (HCC)   2. On amiodarone therapy   3. Chronic anticoagulation   4. RBBB   5. Chronic combined systolic and diastolic heart failure (HCC)    PLAN:    In order of problems listed above:  Unfortunately she has recurrent atrial fibrillation minimally symptomatic she is anticoagulated her rate is controlled with beta-blocker I will increase her amiodarone to 400 mg daily and I asked the family to purchase the mobile Kardia device check daily if it says she is back in sinus rhythm to send through MyChart and I will see back in 2 weeks if atrial fibrillation persist I would favor cardioversion as an outpatient Recheck labs today renal function thyroid She will continue her current anticoagulant Stable EKG pattern Heart failure is compensated continue current loop diuretic as well as ARB and beta-blocker   Next appointment: 2 weeks   Medication Adjustments/Labs and Tests Ordered: Current medicines are reviewed at length with the patient today.  Concerns regarding medicines are outlined above.  No orders of the defined types were placed in this encounter.  No orders of the defined types were placed in this encounter.   Chief Complaint  Patient presents with   Follow-up   Atrial Fibrillation    History of Present Illness:    SAVANNHA Hoffman is a 81 y.o. female with a hx of paroxysmal atrial fibrillation maintaining sinus rhythm on amiodarone chronic anticoagulation tachycardia induced cardiomyopathy right bundle branch block heart failure combined systolic diastolic and mitral regurgitation  last seen 12/18/2021.  Compliance with diet, lifestyle and medications: Yes  Recent labs PCP [facility] 03/19/2022: CMP normal potassium  4.5 creatinine 0.92 minimal elevation alkaline phosphatase transaminases were normal  TSH normal 2.09 hemoglobin 11.3 platelets 215,000 01 12/30/2021 cholesterol 202 LDL 137  Since last seen she has had a abscess in her left lower extremity below the knee that was drained continues to be packed daily but is healing nicely She required a rehab stay to return home She was unaware but she is in rate controlled atrial fibrillation today She is not having edema shortness of breath orthopnea chest pain palpitation or syncope She tolerates her anticoagulant without muscle pain or weakness Past Medical History:  Diagnosis Date   Acquired hypothyroidism 07/26/2020   Allergic rhinitis 01/24/2020   Anxiety    Anxiety and depression 01/24/2020   Arthritis    "all over" (04/29/2016)   Chronic atrial fibrillation with RVR (HCC) 01/24/2020   Chronic hip pain    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/30/2016   Closed displaced comminuted fracture of shaft of right humerus 04/28/2016   GERD without esophagitis 01/24/2020   Hypertension    no longer on meds   Malaise and fatigue 07/26/2020   Moderate major depression (HCC)    Morbid obesity (HCC)    Morbid obesity with BMI of 45.0-49.9, adult (HCC) 01/24/2020   Restless legs    UTI (urinary tract infection) 04/29/2016   Noted on pre-op urine culture and u/a   Vitamin D deficiency 04/29/2016    Past Surgical History:  Procedure Laterality Date   CATARACT EXTRACTION W/ INTRAOCULAR LENS IMPLANT Left 2014  FRACTURE SURGERY     ORIF HUMERUS FRACTURE Right 04/28/2016   Procedure: OPEN REDUCTION INTERNAL FIXATION (ORIF) RIGHT PROXIMAL HUMERUS FRACTURE;  Surgeon: Altamese Woodbourne, MD;  Location: [place];  Service: Orthopedics;  Laterality: Right;    Current Medications: Current Meds  Medication Sig   acetaminophen (TYLENOL) 500 MG tablet Take 1-2 tablets (500-1,000 mg total) by mouth every 6 (six) hours as needed for mild  pain (or Fever >/= 101).   amiodarone (PACERONE) 100 MG tablet Take 100 mg by mouth daily.   atorvastatin (LIPITOR) 40 MG tablet Take 40 mg by mouth at bedtime.   calcium citrate (CALCITRATE - DOSED IN MG ELEMENTAL CALCIUM) 950 MG tablet Take 1 tablet (200 mg of elemental calcium total) by mouth 2 (two) times daily.   ELIQUIS 5 MG TABS tablet Take 5 mg by mouth 2 (two) times daily.   escitalopram (LEXAPRO) 10 MG tablet Take 10 mg by mouth daily.   furosemide (LASIX) 40 MG tablet Take 1 tablet (40 mg total) by mouth 2 (two) times a week. Take on Sunday and Thursday   levothyroxine (SYNTHROID) 100 MCG tablet Take 100 mcg by mouth every morning.   losartan (COZAAR) 25 MG tablet Take 25 mg by mouth daily.   metoprolol succinate (TOPROL-XL) 25 MG 24 hr tablet Take 25 mg by mouth daily.   montelukast (SINGULAIR) 10 MG tablet Take 10 mg by mouth at bedtime.   potassium chloride SA (KLOR-CON M) 20 MEQ tablet Take 1 tablet (20 mEq total) by mouth daily.   traMADol (ULTRAM) 50 MG tablet Take 50 mg by mouth every 6 (six) hours as needed for pain.   triamcinolone ointment (KENALOG) 0.1 % Apply 1 application topically 3 (three) times daily as needed (irritation).   [DISCONTINUED] albuterol (VENTOLIN HFA) 108 (90 Base) MCG/ACT inhaler Inhale 2 puffs into the lungs every 4 (four) hours as needed for shortness of breath.   [DISCONTINUED] amoxicillin (AMOXIL) 500 MG tablet Take 500 mg by mouth 2 (two) times daily.   [DISCONTINUED] amoxicillin-clavulanate (AUGMENTIN) 875-125 MG tablet Take 1 tablet by mouth 2 (two) times daily.   [DISCONTINUED] predniSONE (DELTASONE) 20 MG tablet Take 1 tablet (20 mg total) by mouth daily with breakfast. TAKE 60 MG TAKE 40 MG TAKE 40 MG TAKE 60 MG  TAKE 20 MG  TAKE 60 MG TAKE 10 MG TAKE 10 MG     Allergies:   Penicillins   Social History   Socioeconomic History   Marital status: Divorced    Spouse name: Not on file   Number of children: Not on file   Years of education:  Not on file   Highest education level: Not on file  Occupational History   Not on file  Tobacco Use   Smoking status: Never   Smokeless tobacco: Never  Vaping Use   Vaping Use: Never used  Substance and Sexual Activity   Alcohol use: No   Drug use: No   Sexual activity: Never  Other Topics Concern   Not on file  Social History Narrative   Not on file   Social Determinants of Health   Financial Resource Strain: Not on file  Food Insecurity: Not on file  Transportation Needs: Not on file  Physical Activity: Not on file  Stress: Not on file  Social Connections: Not on file     Family History: The patient's family history includes Breast cancer in her sister; Coronary artery disease in her brother and father; Diverticulitis in her mother;  Heart attack in her father; Hypertension in her mother. ROS:   Please see the history of present illness.    All other systems reviewed and are negative.  EKGs/Labs/Other Studies Reviewed:    The following studies were reviewed today:  EKG:  EKG ordered today and personally reviewed.  The ekg ordered today demonstrates atrial fibrillation 89 bpm right bundle branch block left axis deviation consider old inferior MI consider RVH  Recent Labs: 12/18/2021: ALT 42; BUN 22; Creatinine, Ser 0.75; Hemoglobin 13.9; Platelets 216; Potassium 4.7; Sodium 142; TSH 1.890  Recent Lipid Panel    Component Value Date/Time   CHOL 140 12/18/2021 1549   TRIG 58 12/18/2021 1549   HDL 67 12/18/2021 1549   CHOLHDL 2.1 12/18/2021 1549   LDLCALC 61 12/18/2021 1549    Physical Exam:    VS:  BP 132/68 (BP Location: Left Arm)   Pulse 89   Ht 5\' 3"  (1.6 m)   Wt 265 lb 6.4 oz (120.4 kg)   SpO2 91%   BMI 47.01 kg/m     Wt Readings from Last 3 Encounters:  04/02/22 265 lb 6.4 oz (120.4 kg)  12/18/21 254 lb 12.8 oz (115.6 kg)  09/11/21 252 lb (114.3 kg)     GEN: Obese well nourished, well developed in no acute distress HEENT: Normal NECK: No JVD; No  carotid bruits LYMPHATICS: No lymphadenopathy CARDIAC: Rhythm is irregular no murmurs, rubs, gallops RESPIRATORY:  Clear to auscultation without rales, wheezing or rhonchi  ABDOMEN: Soft, non-tender, non-distended MUSCULOSKELETAL:  No edema; No deformity  SKIN: Warm and dry NEUROLOGIC:  Alert and oriented x 3 PSYCHIATRIC:  Normal affect    Signed, 11/11/21, MD  04/02/2022 4:58 PM    [facility]

## 2022-04-02 ENCOUNTER — Encounter: Payer: Self-pay | Admitting: Cardiology

## 2022-04-02 ENCOUNTER — Ambulatory Visit: Payer: PPO | Attending: Cardiology | Admitting: Cardiology

## 2022-04-02 VITALS — BP 132/68 | HR 89 | Ht 63.0 in | Wt 265.4 lb

## 2022-04-02 DIAGNOSIS — I480 Paroxysmal atrial fibrillation: Secondary | ICD-10-CM | POA: Diagnosis not present

## 2022-04-02 DIAGNOSIS — Z7901 Long term (current) use of anticoagulants: Secondary | ICD-10-CM

## 2022-04-02 DIAGNOSIS — I4510 Unspecified right bundle-branch block: Secondary | ICD-10-CM | POA: Diagnosis not present

## 2022-04-02 DIAGNOSIS — Z79899 Other long term (current) drug therapy: Secondary | ICD-10-CM | POA: Diagnosis not present

## 2022-04-02 DIAGNOSIS — I5042 Chronic combined systolic (congestive) and diastolic (congestive) heart failure: Secondary | ICD-10-CM

## 2022-04-02 MED ORDER — AMIODARONE HCL 200 MG PO TABS: 200.0000 mg | ORAL_TABLET | Freq: Two times a day (BID) | ORAL | 3 refills | Status: DC

## 2022-04-02 NOTE — Patient Instructions (Addendum)
Medication Instructions:  Your physician has recommended you make the following change in your medication:  Increase Amiodarone to 200 mg two times daily  *If you need a refill on your cardiac medications before your next appointment, please call your pharmacy*   Lab Work: Your physician recommends that you return for lab work in: Today for [place], TSH and T4 and T3  If you have labs (blood work) drawn today and your tests are completely normal, you will receive your results only by: [place] (if you have [place]) OR A paper copy in the mail If you have any lab test that is abnormal or we need to change your treatment, we will call you to review the results.   Testing/Procedures: NONE   Follow-Up: At [facility], you and your health needs are our priority.  As part of our continuing mission to provide you with exceptional heart care, we have created designated Provider Care Teams.  These Care Teams include your primary Cardiologist (physician) and Advanced Practice Providers (APPs -  Physician Assistants and Nurse Practitioners) who all work together to provide you with the care you need, when you need it.  We recommend signing up for the patient portal called "MyChart".  Sign up information is provided on this After Visit Summary.  MyChart is used to connect with patients for Virtual Visits (Telemedicine).  Patients are able to view lab/test results, encounter notes, upcoming appointments, etc.  Non-urgent messages can be sent to your provider as well.   To learn more about what you can do with MyChart, go to [email or web address].    Your next appointment:   3 week(s)  The format for your next appointment:   In Person  Provider:   Shirlee More, MD    Other Instructions Get [place] and check daily and send by [email or web address]        FDA-cleared,  clinical grade mobile EKG monitor: Jodelle Red is the most clinically-validated mobile EKG used by the world's leading cardiac care medical professionals With Basic service, know instantly if your heart rhythm is normal or if atrial fibrillation is detected, and email the last single EKG recording to yourself or your doctor Premium service, available for purchase through the Kardia app for $9.99 per month or $99 per year, includes unlimited history and storage of your EKG recordings, a monthly EKG summary report to share with your doctor, along with the ability to track your blood pressure, activity and weight Includes one KardiaMobile phone clip FREE SHIPPING: Standard delivery 1-3 business days. Orders placed by 11:00am PST will ship that afternoon. Otherwise, will ship next business day. All orders ship via [employer] from [place], [place]

## 2022-04-02 NOTE — Addendum Note (Signed)
Addended by: Jerl Santos R on: 04/02/2022 05:10 PM   Modules accepted: Orders

## 2022-04-03 LAB — T3, FREE: T3, Free: 1.8 pg/mL — ABNORMAL LOW (ref 2.0–4.4)

## 2022-04-03 LAB — COMPREHENSIVE METABOLIC PANEL
ALT: 20 IU/L (ref 0–32)
AST: 17 IU/L (ref 0–40)
Albumin/Globulin Ratio: 1.7 (ref 1.2–2.2)
Albumin: 4.3 g/dL (ref 3.7–4.7)
Alkaline Phosphatase: 119 IU/L (ref 44–121)
BUN/Creatinine Ratio: 28 (ref 12–28)
BUN: 22 mg/dL (ref 8–27)
Bilirubin Total: 0.3 mg/dL (ref 0.0–1.2)
CO2: 23 mmol/L (ref 20–29)
Calcium: 9.2 mg/dL (ref 8.7–10.3)
Chloride: 103 mmol/L (ref 96–106)
Creatinine, Ser: 0.8 mg/dL (ref 0.57–1.00)
Globulin, Total: 2.5 g/dL (ref 1.5–4.5)
Glucose: 103 mg/dL — ABNORMAL HIGH (ref 70–99)
Potassium: 4.6 mmol/L (ref 3.5–5.2)
Sodium: 143 mmol/L (ref 134–144)
Total Protein: 6.8 g/dL (ref 6.0–8.5)
eGFR: 73 mL/min/{1.73_m2} (ref >59)

## 2022-04-03 LAB — T4, FREE: Free T4: 1.68 ng/dL (ref 0.82–1.77)

## 2022-04-03 LAB — TSH: TSH: 2.13 u[IU]/mL (ref 0.450–4.500)

## 2022-04-08 ENCOUNTER — Encounter: Payer: Self-pay | Admitting: Cardiology

## 2022-04-24 ENCOUNTER — Ambulatory Visit: Payer: PPO | Attending: Cardiology | Admitting: Cardiology

## 2022-04-24 ENCOUNTER — Encounter: Payer: Self-pay | Admitting: Cardiology

## 2022-04-24 VITALS — BP 149/87 | HR 80 | Ht 63.0 in | Wt 272.0 lb

## 2022-04-24 DIAGNOSIS — I480 Paroxysmal atrial fibrillation: Secondary | ICD-10-CM | POA: Diagnosis not present

## 2022-04-24 DIAGNOSIS — I4510 Unspecified right bundle-branch block: Secondary | ICD-10-CM | POA: Diagnosis not present

## 2022-04-24 DIAGNOSIS — Z79899 Other long term (current) drug therapy: Secondary | ICD-10-CM

## 2022-04-24 DIAGNOSIS — I5042 Chronic combined systolic (congestive) and diastolic (congestive) heart failure: Secondary | ICD-10-CM

## 2022-04-24 DIAGNOSIS — Z7901 Long term (current) use of anticoagulants: Secondary | ICD-10-CM | POA: Diagnosis not present

## 2022-04-24 NOTE — Addendum Note (Signed)
Addended by: Edwyna Shell I on: 04/24/2022 10:58 AM   Modules accepted: Orders

## 2022-04-24 NOTE — Progress Notes (Signed)
Cardiology Office Note:    Date:  04/24/2022   ID:  Rebecca Hoffman, DOB 06-06-1941, MRN [id number]  PCP:  Audie Pinto, FNP  Cardiologist:  Norman Herrlich, MD    Referring MD: Audie Pinto, FNP    ASSESSMENT:    1. Paroxysmal atrial fibrillation (HCC)   2. On amiodarone therapy   3. Chronic anticoagulation   4. RBBB   5. Chronic combined systolic and diastolic heart failure (HCC)    PLAN:    In order of problems listed above:  She remains in atrial fibrillation opts to withdraw amiodarone rate control with beta-blocker continue anticoagulant.  If symptomatic we could consider cardioversion referral to EP in the future Stable EKG pattern Compensated heart failure Continue her loop diuretic ARB and beta-blocker and trend outpatient blood pressure with her PCP  Next appointment: 6 months   Medication Adjustments/Labs and Tests Ordered: Current medicines are reviewed at length with the patient today.  Concerns regarding medicines are outlined above.  Orders Placed This Encounter  Procedures   EKG 12-Lead   No orders of the defined types were placed in this encounter.   Chief Complaint  Patient presents with   Follow-up  For atrial fibrillation  History of Present Illness:    Rebecca Hoffman is a 81 y.o. female with a hx of paroxysmal atrial fibrillation on amiodarone chronic anticoagulation right bundle branch block and heart failure last seen 04/02/2022 with recurrent atrial fibrillation her amiodarone was increased and she was advised to purchase the mobile Kardia device to monitor her rhythm at home.  She had been admitted to [facility] in June 2021 from urgent care new-onset atrial fibrillation rapid rate. She was treated beta-blockers but rate was controlled is found to be in heart failure and treated with IV diuretic.Echo Cardiogram showed mildly diminished ejection fraction 45 to 50% she was felt to have tachycardia induced cardiomyopathy along with mild right  ventricular enlargement mild to moderate tricuspid and mitral regurgitation. TSH was elevated but free T3 and T4 were normal and she was anticoagulated with Eliquis. CTA of the chest showed small pericardial effusion no evidence of pulmonary embolism and minimal right pleural effusion. Laboratory test showed a hemoglobin of 13 6 platelets 208,000 creatinine 0.50 GFR greater than 60 cc proBNP prior to discharge 817 and serial troponins were undetectable.  Recent labs [facility] 03/19/2022: Sodium 142 potassium 4.5 creatinine 0.92 GFR 62 cc/min Iron 47% saturation 10% Hemoglobin 11.3 hematocrit 34.8 platelets 215,000 TSH 2.09  07/31/2021: Cholesterol 202 LDL 137 triglycerides 97 HDL 54  Compliance with diet, lifestyle and medications: Yes  She brings her mobile cardia strips for me to look at and she remains in atrial fibrillation confirmed on her EKG She has decided not to have cardioversion withdrawal amiodarone and if symptomatic we could consider cardioversion and resuming antiarrhythmic therapy. Not having edema shortness of breath chest pain palpitation or syncope Past Medical History:  Diagnosis Date   Acquired hypothyroidism 07/26/2020   Allergic rhinitis 01/24/2020   Anxiety    Anxiety and depression 01/24/2020   Arthritis    "all over" (04/29/2016)   At high risk for falls 03/19/2022   Formatting of this note might be different from the original. Uses Rollator and WC   Chronic atrial fibrillation with RVR (HCC) 01/24/2020   Chronic combined systolic and diastolic heart failure (HCC) 03/19/2022   Formatting of this note might be different from the original. 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 Etiology is unknown. See labs 03/2022   Localized edema 03/19/2022   Formatting of this note might be different from the original. Chronic   Malaise and fatigue 07/26/2020   Mixed hyperlipidemia 03/19/2022   Moderate major depression (HCC)    Morbid obesity ([place])    Morbid obesity with BMI of 45.0-49.9, adult ([place]) 01/24/2020   Prediabetes 03/19/2022   Primary osteoarthritis involving multiple joints 01/29/2021   Formatting of this note might be different from the original. Formatting of this note might be different from the original. "all over" (04/29/2016)   Restless legs    UTI (urinary tract infection) 04/29/2016   Noted on pre-op urine culture and u/a   Vitamin B 12 deficiency 03/22/2022   Vitamin D deficiency 04/29/2016    Past Surgical History:  Procedure Laterality Date   CATARACT EXTRACTION W/ INTRAOCULAR LENS IMPLANT Left 2014   FRACTURE SURGERY     ORIF HUMERUS FRACTURE Right 04/28/2016   Procedure: OPEN REDUCTION INTERNAL FIXATION (ORIF) RIGHT PROXIMAL HUMERUS FRACTURE;  Surgeon: Altamese Okoboji, MD;  Location: [place];  Service: Orthopedics;  Laterality: Right;    Current Medications: Current Meds  Medication Sig   acetaminophen (TYLENOL) 500 MG tablet Take 1-2 tablets (500-1,000 mg total) by mouth every 6 (six) hours as needed for mild pain (or Fever >/= 101).   amiodarone (PACERONE) 200 MG tablet Take 1 tablet (200 mg total) by mouth 2 (two) times daily.   atorvastatin (LIPITOR) 40 MG tablet Take 40 mg by mouth at bedtime.   calcium citrate (CALCITRATE - DOSED IN MG ELEMENTAL CALCIUM) 950 MG tablet Take 1 tablet (200 mg of elemental calcium total) by mouth 2 (two) times daily.   ELIQUIS 5 MG TABS tablet Take 5 mg by mouth 2 (two) times daily.   escitalopram (LEXAPRO) 10 MG tablet Take 10 mg by mouth daily.   furosemide (LASIX) 40 MG tablet Take  1 tablet (40 mg total) by mouth 2 (two) times a week. Take on Sunday and Thursday   levothyroxine (SYNTHROID) 100 MCG tablet Take 100 mcg by mouth every morning.   losartan (COZAAR) 25 MG tablet Take 25 mg by mouth daily.   metoprolol succinate (TOPROL-XL) 25 MG 24 hr tablet Take 25 mg by mouth daily.   montelukast (SINGULAIR) 10 MG tablet Take 10 mg by mouth at bedtime.   potassium chloride SA (KLOR-CON M) 20 MEQ tablet Take 1 tablet (20 mEq total) by mouth daily.   traMADol (ULTRAM) 50 MG tablet Take 50 mg by mouth every 6 (six) hours as needed for pain.   triamcinolone ointment (KENALOG) 0.1 % Apply 1 application topically 3 (three) times daily as needed (irritation).     Allergies:   Penicillins   Social History   Socioeconomic History   Marital status: Divorced    Spouse name: Not on file   Number of children: Not on file   Years of education: Not on file   Highest education level: Not on file  Occupational History   Not on file  Tobacco  Use   Smoking status: Never   Smokeless tobacco: Never  Vaping Use   Vaping Use: Never used  Substance and Sexual Activity   Alcohol use: No   Drug use: No   Sexual activity: Never  Other Topics Concern   Not on file  Social History Narrative   Not on file   Social Determinants of Health   Financial Resource Strain: Not on file  Food Insecurity: Not on file  Transportation Needs: Not on file  Physical Activity: Not on file  Stress: Not on file  Social Connections: Not on file     Family History: The patient's family history includes Breast cancer in her sister; Coronary artery disease in her brother and father; Diverticulitis in her mother; Heart attack in her father; Hypertension in her mother. ROS:   Please see the history of present illness.    All other systems reviewed and are negative.  EKGs/Labs/Other Studies Reviewed:    The following studies were reviewed today:  EKG:  EKG ordered today and personally reviewed.   The ekg ordered today demonstrates atrial fibrillation right bundle branch block pattern of old inferior infarction  Recent Labs: 12/18/2021: Hemoglobin 13.9; Platelets 216 04/02/2022: ALT 20; BUN 22; Creatinine, Ser 0.80; Potassium 4.6; Sodium 143; TSH 2.130  Recent Lipid Panel    Component Value Date/Time   CHOL 140 12/18/2021 1549   TRIG 58 12/18/2021 1549   HDL 67 12/18/2021 1549   CHOLHDL 2.1 12/18/2021 1549   LDLCALC 61 12/18/2021 1549    Physical Exam:    VS:  BP (!) 179/85 (BP Location: Left Arm, Patient Position: Sitting, Cuff Size: Normal)   Pulse 80   Ht 5\' 3"  (1.6 m)   Wt 272 lb (123.4 kg)   SpO2 91%   BMI 48.18 kg/m     Wt Readings from Last 3 Encounters:  04/24/22 272 lb (123.4 kg)  04/02/22 265 lb 6.4 oz (120.4 kg)  12/18/21 254 lb 12.8 oz (115.6 kg)     GEN:  Well nourished, well developed in no acute distress HEENT: Normal NECK: No JVD; No carotid bruits LYMPHATICS: No lymphadenopathy CARDIAC: Irregular rate and rhythm no murmurs, rubs, gallops RESPIRATORY:  Clear to auscultation without rales, wheezing or rhonchi  ABDOMEN: Soft, non-tender, non-distended MUSCULOSKELETAL:  No edema; No deformity  SKIN: Warm and dry NEUROLOGIC:  Alert and oriented x 3 PSYCHIATRIC:  Normal affect    Signed, 02/17/22, MD  04/24/2022 10:37 AM    [facility]

## 2022-04-24 NOTE — Patient Instructions (Signed)
Medication Instructions:  Your physician has recommended you make the following change in your medication:   STOP:Amiodarone  *If you need a refill on your cardiac medications before your next appointment, please call your pharmacy*   Lab Work: None If you have labs (blood work) drawn today and your tests are completely normal, you will receive your results only by: [place] (if you have MyChart) OR A paper copy in the mail If you have any lab test that is abnormal or we need to change your treatment, we will call you to review the results.   Testing/Procedures: None   Follow-Up: At [facility], you and your health needs are our priority.  As part of our continuing mission to provide you with exceptional heart care, we have created designated Provider Care Teams.  These Care Teams include your primary Cardiologist (physician) and Advanced Practice Providers (APPs -  Physician Assistants and Nurse Practitioners) who all work together to provide you with the care you need, when you need it.  We recommend signing up for the patient portal called "MyChart".  Sign up information is provided on this After Visit Summary.  MyChart is used to connect with patients for Virtual Visits (Telemedicine).  Patients are able to view lab/test results, encounter notes, upcoming appointments, etc.  Non-urgent messages can be sent to your provider as well.   To learn more about what you can do with MyChart, go to [email or web address].    Your next appointment:   6 month(s)  The format for your next appointment:   In Person  Provider:   Shirlee More, MD  Other Instructions None  Important Information About Sugar

## 2022-06-01 ENCOUNTER — Encounter: Payer: Self-pay | Admitting: Cardiology

## 2022-06-02 ENCOUNTER — Other Ambulatory Visit: Payer: Self-pay

## 2022-06-16 NOTE — Progress Notes (Deleted)
Cardiology Office Note:    Date:  06/16/2022   ID:  Rebecca Hoffman, DOB December 23, 1940, MRN [id number]  PCP:  Audie Pinto, FNP  Cardiologist:  Norman Herrlich, MD    Referring MD: Audie Pinto, FNP    ASSESSMENT:    No diagnosis found. PLAN:    In order of problems listed above:  ***   Next appointment: ***   Medication Adjustments/Labs and Tests Ordered: Current medicines are reviewed at length with the patient today.  Concerns regarding medicines are outlined above.  No orders of the defined types were placed in this encounter.  No orders of the defined types were placed in this encounter.   No chief complaint on file.   History of Present Illness:    Rebecca Hoffman is a 81 y.o. female with a hx of atrial fibrillation persistent with recent withdrawal of amiodarone and opted for rate control with a beta-blocker and anticoagulation, right bundle branch block and heart failure 04/24/2022 last seen .She had been admitted to [facility] in June 2021 from urgent care new-onset atrial fibrillation rapid rate. She was treated beta-blockers but rate was controlled is found to be in heart failure and treated with IV diuretic.EchoCardiogram showed mildly diminished ejection fraction 45 to 50% she was felt to have tachycardia induced cardiomyopathy along with mild right ventricular enlargement mild to moderate tricuspid and mitral regurgitation.  Compliance with diet, lifestyle and medications: *** Past Medical History:  Diagnosis Date   Acquired hypothyroidism 07/26/2020   Allergic rhinitis 01/24/2020   Anxiety    Anxiety and depression 01/24/2020   Arthritis    "all over" (04/29/2016)   At high risk for falls 03/19/2022   Formatting of this note might be different from the original. Uses Rollator and WC   Chronic atrial fibrillation with RVR (HCC) 01/24/2020   Chronic combined systolic and diastolic heart failure (HCC) 03/19/2022   Formatting of this note might be different from the  original. 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 Etiology is unknown. See labs 03/2022   Localized edema 03/19/2022   Formatting of this note might be different from the original. Chronic   Malaise and fatigue 07/26/2020   Mixed hyperlipidemia 03/19/2022   Moderate major depression (HCC)    Morbid obesity (HCC)    Morbid obesity with BMI of 45.0-49.9, adult (HCC) 01/24/2020   Prediabetes 03/19/2022   Primary osteoarthritis involving multiple joints 01/29/2021   Formatting of this note might be different from the original. Formatting of this note might be different from the original. "all over" (04/29/2016)   Restless legs    UTI (urinary tract infection) 04/29/2016   Noted on pre-op urine culture and u/a   Vitamin B 12 deficiency 03/22/2022   Vitamin D deficiency 04/29/2016    Past Surgical History:  Procedure Laterality Date   CATARACT EXTRACTION W/ INTRAOCULAR LENS IMPLANT Left 2014   FRACTURE SURGERY     ORIF HUMERUS FRACTURE Right 04/28/2016   Procedure: OPEN REDUCTION INTERNAL FIXATION (ORIF) RIGHT PROXIMAL HUMERUS FRACTURE;  Surgeon: Altamese , MD;  Location: [facility];  Service: Orthopedics;  Laterality: Right;    Current Medications: No outpatient medications have been marked as taking for the 06/17/22 encounter (Appointment) with Richardo Priest, MD.     Allergies:   Penicillins   Social History   Socioeconomic History   Marital status: Divorced    Spouse name: Not on file   Number of children: Not on file   Years of education: Not on file   Highest education  level: Not on file  Occupational History   Not on file  Tobacco Use   Smoking status: Never   Smokeless tobacco: Never  Vaping Use   Vaping Use: Never used  Substance and Sexual Activity   Alcohol use: No   Drug use: No   Sexual activity: Never  Other Topics Concern   Not on file  Social History Narrative   Not on file   Social Determinants of Health   Financial Resource Strain: Not on file  Food Insecurity: Not on file  Transportation Needs: Not on file  Physical Activity: Not on file  Stress: Not on file  Social Connections: Not on file     Family History: The patient's ***family history includes Breast cancer in her sister; Coronary artery disease in her brother and father; Diverticulitis in her mother; Heart attack in her father; Hypertension in her mother. ROS:   Please see the history of present illness.    All other systems reviewed and are negative.  EKGs/Labs/Other Studies Reviewed:    The following studies were reviewed today:  EKG:  EKG ordered today and personally reviewed.  The ekg ordered today demonstrates ***  Recent Labs: 12/18/2021: Hemoglobin 13.9; Platelets 216 04/02/2022: ALT 20; BUN 22; Creatinine, Ser 0.80; Potassium 4.6; Sodium 143; TSH 2.130  Recent Lipid Panel    Component Value Date/Time   CHOL 140 12/18/2021 1549   TRIG 58 12/18/2021 1549   HDL 67 12/18/2021 1549   CHOLHDL 2.1 12/18/2021 1549   LDLCALC 61 12/18/2021 1549    Physical Exam:    VS:  There were no vitals taken for this visit.    Wt Readings from Last 3 Encounters:  04/24/22 272 lb (123.4 kg)  04/02/22 265 lb 6.4 oz (120.4 kg)  12/18/21 254 lb 12.8 oz (115.6 kg)     GEN: *** Well nourished, well developed in no acute distress HEENT: Normal NECK: No JVD; No carotid bruits LYMPHATICS: No lymphadenopathy CARDIAC: ***RRR, no murmurs, rubs, gallops RESPIRATORY:  Clear to auscultation without rales, wheezing or rhonchi  ABDOMEN: Soft, non-tender,  non-distended MUSCULOSKELETAL:  No edema; No deformity  SKIN: Warm and dry NEUROLOGIC:  Alert and oriented x 3 PSYCHIATRIC:  Normal affect    Signed, Shirlee More, MD  06/16/2022 3:29 PM    [facility]

## 2022-06-17 ENCOUNTER — Ambulatory Visit: Payer: PPO | Admitting: Cardiology

## 2022-07-08 NOTE — Telephone Encounter (Signed)
Review upon your return. 

## 2022-07-15 ENCOUNTER — Telehealth: Payer: Self-pay | Admitting: Cardiology

## 2022-07-15 NOTE — Telephone Encounter (Signed)
Pt is requesting call back to discuss future appt. Did not want to offer further details for call back.

## 2022-07-17 NOTE — Telephone Encounter (Signed)
Left message for the patient to call back.

## 2022-07-18 NOTE — Telephone Encounter (Signed)
Left message for the patient to call back.

## 2022-07-21 ENCOUNTER — Telehealth (HOSPITAL_COMMUNITY): Payer: Self-pay | Admitting: Licensed Clinical Social Worker

## 2022-07-21 NOTE — Telephone Encounter (Signed)
Called patient and she reported that the only way she had to get to her appointment was to call an ambulance. I asked her not to do that, but to let me see what other options are available to help her get to her appointment on 07/23/22. Patient stated she would not call the ambulance.

## 2022-07-21 NOTE — Telephone Encounter (Signed)
CSW received referral to contact patient to assist with transportation. Message left for return call. Raquel Sarna, [place], [place]

## 2022-07-22 ENCOUNTER — Telehealth (HOSPITAL_COMMUNITY): Payer: Self-pay | Admitting: Licensed Clinical Social Worker

## 2022-07-22 NOTE — Telephone Encounter (Signed)
CSW requested to assist patient with door to door transportation to her appointment on 07-23-22 in the [place] office. CSW made arrangements with [employer] and they will contact patient for confirmation on pick up time. Patient reviewed the waiver and will sign upon arrival at the office. Patient grateful for the support and assistance with transportation. Raquel Sarna, [place], CCSW-MCS [phone]   07/22/2022  MILDA LINDVALL DOB: 04/02/1941 MRN: [id number]   RIDER WAIVER AND RELEASE OF LIABILITY  For the purposes of helping with transportation needs, [facility] partners with outside transportation providers (taxi companies, [place], Social research officer, government.) to give [employer] patients or other approved people the choice of on-demand rides [employer]") to our buildings for non-emergency visits.  By using [employer], I, the person signing this document, on behalf of myself and/or any legal minors (in my care using the [employer]), agree:  Government social research officer given to me are supplied by independent, outside transportation providers who do not work for, or have any affiliation with, [employer]. [facility] is not a transportation company. [facility] has no control over the quality or safety of the rides I get using [employer]. [facility] has no control over whether any outside ride will happen on time or not. [facility] gives no guarantee on the reliability, quality, safety, or availability on any rides, or that no mistakes will happen. I know and accept that traveling by vehicle (car, truck, SVU, Lucianne Lei, bus, taxi, etc.) has risks of serious injuries such as disability, being paralyzed, and death. I know and agree the risk of using [employer] is mine alone, and not [employer]. Transport Services are provided "as is" and as are available. The transportation providers are in charge for all inspections and care of the vehicles used to provide these rides. I agree not to take  legal action against [facility], its agents, employees, officers, directors, representatives, insurers, attorneys, assigns, successors, subsidiaries, and affiliates at any time for any reasons related directly or indirectly to using [employer]. I also agree not to take legal action against  or its affiliates for any injury, death, or damage to property caused by or related to using [employer]. I have read this Waiver and Release of Liability, and I understand the terms used in it and their legal meaning. This Waiver is freely and voluntarily given with the understanding that my right (or any legal minors) to legal action against [facility] relating to [employer] is knowingly given up to use these services.   I attest that I read the Ride Waiver and Release of Liability to Vickey Huger, gave Ms. Pak the opportunity to ask questions and answered the questions asked (if any). I affirm that Vickey Huger then provided consent for assistance with transportation.     Louann Liv, [employer], [place]

## 2022-07-22 NOTE — Progress Notes (Unsigned)
Cardiology Office Note:    Date:  07/22/2022   ID:  Rebecca Hoffman, DOB 10-Dec-1940, MRN [id number]  PCP:  Rebecca Pinto, FNP  Cardiologist:  Rebecca Herrlich, MD    Referring MD: Rebecca Pinto, FNP    ASSESSMENT:    No diagnosis found. PLAN:    In order of problems listed above:  ***   Next appointment: ***   Medication Adjustments/Labs and Tests Ordered: Current medicines are reviewed at length with the patient today.  Concerns regarding medicines are outlined above.  No orders of the defined types were placed in this encounter.  No orders of the defined types were placed in this encounter.   No chief complaint on file.   History of Present Illness:    Rebecca Hoffman is a 82 y.o. female with a hx of persistent atrial fibrillation failing a chronic anticoagulation left bundle branch block and combined systolic and diastolic heart failure last seen 04/24/2022 with withdrawal of amiodarone.. Compliance with diet, lifestyle and medications: *** Past Medical History:  Diagnosis Date   Acquired hypothyroidism 07/26/2020   Allergic rhinitis 01/24/2020   Anxiety    Anxiety and depression 01/24/2020   Arthritis    "all over" (04/29/2016)   At high risk for falls 03/19/2022   Formatting of this note might be different from the original. Uses Rollator and WC   Chronic atrial fibrillation with RVR (HCC) 01/24/2020   Chronic combined systolic and diastolic heart failure (HCC) 03/19/2022   Formatting of this note might be different from the original. 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 Etiology is unknown. See labs 03/2022   Localized edema 03/19/2022   Formatting of this note might be different from the original. Chronic   Malaise and fatigue 07/26/2020   Mixed hyperlipidemia 03/19/2022   Moderate major depression (HCC)    Morbid obesity (HCC)    Morbid obesity with BMI of 45.0-49.9, adult (HCC) 01/24/2020   Prediabetes 03/19/2022   Primary osteoarthritis involving multiple joints 01/29/2021   Formatting of this note might be different from the original. Formatting of this note might be different from the original. "all over" (04/29/2016)   Restless legs    UTI (urinary tract infection) 04/29/2016   Noted on pre-op urine culture and u/a   Vitamin B 12 deficiency 03/22/2022   Vitamin D deficiency 04/29/2016    Past Surgical History:  Procedure Laterality Date   CATARACT EXTRACTION W/ INTRAOCULAR LENS IMPLANT Left 2014   FRACTURE SURGERY     ORIF HUMERUS FRACTURE Right 04/28/2016   Procedure: OPEN REDUCTION INTERNAL FIXATION (ORIF) RIGHT PROXIMAL HUMERUS FRACTURE;  Surgeon: Rebecca Galas, MD;  Location: [facility];  Service: Orthopedics;  Laterality: Right;    Current Medications: No outpatient medications have been marked as taking for the 07/23/22 encounter (Appointment) with Rebecca Daub, MD.     Allergies:   Penicillins   Social History   Socioeconomic History   Marital status: Divorced    Spouse name: Not on file   Number of children:  Not on file   Years of education: Not on file   Highest education level: Not on file  Occupational History   Not on file  Tobacco Use   Smoking status: Never   Smokeless tobacco: Never  Vaping Use   Vaping Use: Never used  Substance and Sexual Activity   Alcohol use: No   Drug use: No   Sexual activity: Never  Other Topics Concern   Not on file  Social History Narrative   Not on file   Social Determinants of Health   Financial Resource Strain: Not on file  Food Insecurity: Not on file  Transportation  Needs: Unmet Transportation Needs (07/22/2022)   PRAPARE - Hydrologist (Medical): Yes    Lack of Transportation (Non-Medical): Yes  Physical Activity: Not on file  Stress: Not on file  Social Connections: Not on file     Family History: The patient's ***family history includes Breast cancer in her sister; Coronary artery disease in her brother and father; Diverticulitis in her mother; Heart attack in her father; Hypertension in her mother. ROS:   Please see the history of present illness.    All other systems reviewed and are negative.  EKGs/Labs/Other Studies Reviewed:    The following studies were reviewed today:  EKG:  EKG ordered today and personally reviewed.  The ekg ordered today demonstrates *** 05/13/2022 Rebecca Hoffman PCP: CMP normal sodium 143 potassium 4.4 creatinine 0.84 GFR 69 cc TSH 3.72 hemoglobin 12.3 platelets 208,000. Recent Labs: 12/18/2021: Hemoglobin 13.9; Platelets 216 04/02/2022: ALT 20; BUN 22; Creatinine, Ser 0.80; Potassium 4.6; Sodium 143; TSH 2.130  Recent Lipid Panel    Component Value Date/Time   CHOL 140 12/18/2021 1549   TRIG 58 12/18/2021 1549   HDL 67 12/18/2021 1549   CHOLHDL 2.1 12/18/2021 1549   LDLCALC 61 12/18/2021 1549    Physical Exam:    VS:  There were no vitals taken for this visit.    Wt Readings from Last 3 Encounters:  04/24/22 272 lb (123.4 kg)  04/02/22 265 lb 6.4 oz (120.4 kg)  12/18/21 254 lb 12.8 oz (115.6 kg)     GEN: *** Well nourished, well developed in no acute distress HEENT: Normal NECK: No JVD; No carotid bruits LYMPHATICS: No lymphadenopathy CARDIAC: ***RRR, no murmurs, rubs, gallops RESPIRATORY:  Clear to auscultation without rales, wheezing or rhonchi  ABDOMEN: Soft, non-tender, non-distended MUSCULOSKELETAL:  No edema; No deformity  SKIN: Warm and dry NEUROLOGIC:  Alert and oriented x 3 PSYCHIATRIC:  Normal affect    Signed, Rebecca More, MD  07/22/2022 5:28 PM     [facility]

## 2022-07-23 ENCOUNTER — Telehealth: Payer: Self-pay | Admitting: Cardiology

## 2022-07-23 ENCOUNTER — Ambulatory Visit: Payer: PPO | Attending: Cardiology | Admitting: Cardiology

## 2022-07-23 ENCOUNTER — Encounter: Payer: Self-pay | Admitting: Cardiology

## 2022-07-23 VITALS — BP 140/84 | HR 82 | Ht 63.0 in | Wt 272.0 lb

## 2022-07-23 DIAGNOSIS — I4819 Other persistent atrial fibrillation: Secondary | ICD-10-CM

## 2022-07-23 DIAGNOSIS — I5042 Chronic combined systolic (congestive) and diastolic (congestive) heart failure: Secondary | ICD-10-CM | POA: Diagnosis not present

## 2022-07-23 DIAGNOSIS — Z7901 Long term (current) use of anticoagulants: Secondary | ICD-10-CM | POA: Diagnosis not present

## 2022-07-23 DIAGNOSIS — M6281 Muscle weakness (generalized): Secondary | ICD-10-CM

## 2022-07-23 DIAGNOSIS — I447 Left bundle-branch block, unspecified: Secondary | ICD-10-CM

## 2022-07-23 MED ORDER — METOPROLOL SUCCINATE ER 25 MG PO TB24: 12.5000 mg | ORAL_TABLET | Freq: Every day | ORAL | 3 refills | Status: DC

## 2022-07-23 NOTE — Patient Instructions (Signed)
Medication Instructions:  Your physician has recommended you make the following change in your medication:   START: Toprol XL 12.5 mg daily at bedtime STOP: Atorvastatin  *If you need a refill on your cardiac medications before your next appointment, please call your pharmacy*   Lab Work: Your physician recommends that you return for lab work in:   Labs today: BMP, CBC  If you have labs (blood work) drawn today and your tests are completely normal, you will receive your results only by: [place] (if you have MyChart) OR A paper copy in the mail If you have any lab test that is abnormal or we need to change your treatment, we will call you to review the results.   Testing/Procedures: None   Follow-Up: At [facility], you and your health needs are our priority.  As part of our continuing mission to provide you with exceptional heart care, we have created designated Provider Care Teams.  These Care Teams include your primary Cardiologist (physician) and Advanced Practice Providers (APPs -  Physician Assistants and Nurse Practitioners) who all work together to provide you with the care you need, when you need it.  We recommend signing up for the patient portal called "MyChart".  Sign up information is provided on this After Visit Summary.  MyChart is used to connect with patients for Virtual Visits (Telemedicine).  Patients are able to view lab/test results, encounter notes, upcoming appointments, etc.  Non-urgent messages can be sent to your provider as well.   To learn more about what you can do with MyChart, go to [email or web address].    Your next appointment:   6 month(s)  The format for your next appointment:   In Person  Provider:   Shirlee More, MD    Other Instructions None  Important Information About [place]

## 2022-07-23 NOTE — Telephone Encounter (Signed)
Patient is calling stating she slid out of the car and ended up falling from the ride home from our appt. The ambulance was called and she is now waiting to be seen at the hospital. She reports she hurt her leg and was told last time her leg was injured she should have been taken off her blood thinners. Patient is requesting Dr. Bettina Gavia recommendations. Please advise.

## 2022-07-24 ENCOUNTER — Other Ambulatory Visit: Payer: Self-pay

## 2022-07-24 LAB — BASIC METABOLIC PANEL
BUN/Creatinine Ratio: 21 (ref 12–28)
BUN: 16 mg/dL (ref 8–27)
CO2: 23 mmol/L (ref 20–29)
Calcium: 9 mg/dL (ref 8.7–10.3)
Chloride: 103 mmol/L (ref 96–106)
Creatinine, Ser: 0.76 mg/dL (ref 0.57–1.00)
Glucose: 93 mg/dL (ref 70–99)
Potassium: 4.1 mmol/L (ref 3.5–5.2)
Sodium: 142 mmol/L (ref 134–144)
eGFR: 78 mL/min/{1.73_m2} (ref >59)

## 2022-07-24 LAB — CBC
Hematocrit: 36 % (ref 34.0–46.6)
Hemoglobin: 11.6 g/dL (ref 11.1–15.9)
MCH: 28.9 pg (ref 26.6–33.0)
MCHC: 32.2 g/dL (ref 31.5–35.7)
MCV: 90 fL (ref 79–97)
Platelets: 179 10*3/uL (ref 150–450)
RBC: 4.01 x10E6/uL (ref 3.77–5.28)
RDW: 13.9 % (ref 11.7–15.4)
WBC: 8.2 10*3/uL (ref 3.4–10.8)

## 2022-07-25 NOTE — Telephone Encounter (Signed)
Informed patient as per Dr Bettina Gavia ok to hold her Eliquis for 2 days then resume. Patient voices understanding and agrees. States that she will resume on Saturday 07/26/22. Patient thanked me for the call and had no other questions.

## 2022-09-01 DIAGNOSIS — M1990 Unspecified osteoarthritis, unspecified site: Secondary | ICD-10-CM | POA: Insufficient documentation

## 2022-09-01 DIAGNOSIS — D649 Anemia, unspecified: Secondary | ICD-10-CM | POA: Insufficient documentation

## 2022-09-01 DIAGNOSIS — J9690 Respiratory failure, unspecified, unspecified whether with hypoxia or hypercapnia: Secondary | ICD-10-CM | POA: Insufficient documentation

## 2022-09-01 DIAGNOSIS — J449 Chronic obstructive pulmonary disease, unspecified: Secondary | ICD-10-CM | POA: Insufficient documentation

## 2022-09-02 DIAGNOSIS — Z7409 Other reduced mobility: Secondary | ICD-10-CM | POA: Insufficient documentation

## 2022-09-02 DIAGNOSIS — R2681 Unsteadiness on feet: Secondary | ICD-10-CM | POA: Insufficient documentation

## 2022-09-02 DIAGNOSIS — R489 Unspecified symbolic dysfunctions: Secondary | ICD-10-CM | POA: Insufficient documentation

## 2022-09-04 DIAGNOSIS — F29 Unspecified psychosis not due to a substance or known physiological condition: Secondary | ICD-10-CM | POA: Insufficient documentation

## 2022-09-05 DIAGNOSIS — F03911 Unspecified dementia, unspecified severity, with agitation: Secondary | ICD-10-CM | POA: Insufficient documentation

## 2022-09-10 ENCOUNTER — Telehealth: Payer: Self-pay | Admitting: Cardiology

## 2022-09-10 NOTE — Telephone Encounter (Signed)
Patient states she has been at [facility] and she is very sick. She would like to know if he can come by to see her at the hospital. Please advise.

## 2022-09-12 ENCOUNTER — Telehealth: Payer: Self-pay | Admitting: Cardiology

## 2022-09-12 NOTE — Telephone Encounter (Signed)
Spoke with Rebecca Hoffman who states that she wants Dr. Bettina Gavia to call the facility and advise them that she can leave the facility and go home. Advised that this is not something Dr. Bettina Gavia can do as he did not arrange for her to go to rehab. Advised to call her PCP. [name] verbalized understanding and had no additional questions.

## 2022-09-12 NOTE — Telephone Encounter (Signed)
Left message for the patient to call back.

## 2022-09-12 NOTE — Telephone Encounter (Signed)
Patient states that hospital dropped her off at [facility] and is requesting call back to discuss going home. She states they are mistreating her and not allowing her to have cough medication. Requesting return call.

## 2022-09-15 NOTE — Telephone Encounter (Signed)
Called the patient and informed her of Dr. Joya Gaskins response below:  NO they would need to consult the cardiologist in the hospital   Patient had no further questions at this time.

## 2022-11-17 ENCOUNTER — Telehealth: Payer: Self-pay | Admitting: Cardiology

## 2022-11-17 NOTE — Telephone Encounter (Signed)
Pt c/o swelling: STAT is pt has developed SOB within 24 hours  If swelling, where is the swelling located?   Both legs   How much weight have you gained and in what time span?   Yes  Have you gained 3 pounds in a day or 5 pounds in a week?   3 pounds in a day  Do you have a log of your daily weights (if so, list)?   254 lbs last week Not weight last week  Are you currently taking a fluid pill?   Yes - twice daily  Are you currently SOB?  Sometimes, patient stated she is on oxygent  Have you traveled recently?   No  Patient is concerned about her swelling going up and down and wants to know next steps.

## 2022-11-21 ENCOUNTER — Other Ambulatory Visit: Payer: Self-pay

## 2022-11-21 DIAGNOSIS — I5042 Chronic combined systolic (congestive) and diastolic (congestive) heart failure: Secondary | ICD-10-CM

## 2022-11-21 NOTE — Telephone Encounter (Signed)
Called patient and informed her of Dr. Vanetta Shawl recommendation below:  "He need to have proBNP and Chem-7 done first"  Patient was agreeable with this plan and she stated that she would come by the office early next week for her lab draw. Patient had no further questions at this time

## 2022-11-26 LAB — BASIC METABOLIC PANEL
BUN/Creatinine Ratio: 26 (ref 12–28)
BUN: 27 mg/dL (ref 8–27)
CO2: 28 mmol/L (ref 20–29)
Calcium: 9.3 mg/dL (ref 8.7–10.3)
Chloride: 100 mmol/L (ref 96–106)
Creatinine, Ser: 1.02 mg/dL — ABNORMAL HIGH (ref 0.57–1.00)
Glucose: 130 mg/dL — ABNORMAL HIGH (ref 70–99)
Potassium: 4.4 mmol/L (ref 3.5–5.2)
Sodium: 146 mmol/L — ABNORMAL HIGH (ref 134–144)
eGFR: 54 mL/min/{1.73_m2} — ABNORMAL LOW (ref >59)

## 2022-11-26 LAB — PRO B NATRIURETIC PEPTIDE: NT-Pro BNP: 1453 pg/mL — ABNORMAL HIGH (ref 0–738)

## 2022-12-04 DIAGNOSIS — R41841 Cognitive communication deficit: Secondary | ICD-10-CM | POA: Insufficient documentation

## 2022-12-31 ENCOUNTER — Encounter: Payer: PPO | Admitting: Podiatry

## 2023-01-02 NOTE — Progress Notes (Signed)
This encounter was created in error - please disregard.  Patient was a no show for scheduled appt today. 

## 2023-03-03 ENCOUNTER — Telehealth: Payer: Self-pay | Admitting: Cardiology

## 2023-03-03 NOTE — Telephone Encounter (Signed)
Pt asked if Dr. Dulce Sellar can prescribe her a covid med. Please advise.

## 2023-03-03 NOTE — Telephone Encounter (Signed)
Left message for the patient to call back.

## 2023-03-04 NOTE — Telephone Encounter (Signed)
Sent patient a message and informed her that Dr. Dulce Sellar recommends she follow up with her PCP for Covid infection questions. Patient was agreeable with this plan and had no further testing at this time.

## 2023-03-05 ENCOUNTER — Telehealth: Payer: Self-pay | Admitting: Cardiology

## 2023-03-05 NOTE — Progress Notes (Signed)
Cardiology Office Note:    Date:  03/06/2023   ID:  Rebecca Hoffman, DOB [birth year], MRN [id number]  PCP:  Audie Pinto, FNP  Cardiologist:  Norman Herrlich, MD    Referring MD: Audie Pinto, FNP    ASSESSMENT:    1. Persistent atrial fibrillation (HCC)   2. On amiodarone therapy   3. Chronic anticoagulation   4. LBBB (left bundle branch block)   5. Chronic combined systolic and diastolic heart failure (HCC)    PLAN:    In order of problems listed above:  From a cardiology perspective she continues to do well very low-dose amiodarone maintaining sinus rhythm will continue her current anticoagulant  stable EKG pattern atypical left bundle branch block conduction delay first-degree AV block Heart failure is nicely compensated she has no edema not short of breath continue her loop diuretic ARB for hypertension and beta-blocker   Next appointment: 56-month follow-up on amiodarone   Medication Adjustments/Labs and Tests Ordered: Current medicines are reviewed at length with the patient today.  Concerns regarding medicines are outlined above.  Orders Placed This Encounter  Procedures   EKG 12-Lead   No orders of the defined types were placed in this encounter.    History of Present Illness:    Rebecca Hoffman is a 82 y.o. female with a hx of persistent atrial fibrillation with chronic anticoagulation left bundle branch block hyperlipidemia taking a statin muscle weakness and combined systolic and diastolic heart failure last seen 07/23/2022.  Compliance with diet, lifestyle and medications: Yes  She is unhappy where she is living but is not having cardiovascular symptoms of edema shortness of breath chest pain palpitation or syncope She shows me her hands tells me she has psoriasis but what it is is ecchymoses from her current anticoagulant She has had no mucosal bleeding To screen for toxicity today we will check a CBC and thyroid studies follow-up in the office 6 months She  is taking low-dose Celexa appropriate for age and antiarrhythmic drug usage Past Medical History:  Diagnosis Date   Acquired hypothyroidism 07/26/2020   Allergic rhinitis 01/24/2020   Anxiety    Anxiety and depression 01/24/2020   Arthritis    "all over" (04/29/2016)   At high risk for falls 03/19/2022   Formatting of this note might be different from the original. Uses Rollator and WC   Chronic atrial fibrillation with RVR (HCC) 01/24/2020   Chronic combined systolic and diastolic heart failure (HCC) 03/19/2022   Formatting of this note might be different from the original. 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 Etiology is unknown. See labs 03/2022   Localized edema 03/19/2022   Formatting of this note might be different from the original. Chronic   Malaise and fatigue 07/26/2020   Mixed hyperlipidemia 03/19/2022   Moderate major depression (HCC)    Morbid obesity (HCC)    Morbid obesity with BMI of 45.0-49.9, adult (HCC) 01/24/2020   Prediabetes 03/19/2022   Primary osteoarthritis involving multiple joints 01/29/2021   Formatting of this note  might be different from the original. Formatting of this note might be different from the original. "all over" (04/29/2016)   Restless legs    UTI (urinary tract infection) 04/29/2016   Noted on pre-op urine culture and u/a   Vitamin B 12 deficiency 03/22/2022   Vitamin D deficiency 04/29/2016    Current Medications: Current Meds  Medication Sig   acetaminophen (TYLENOL) 500 MG tablet Take 1-2 tablets (500-1,000 mg total) by mouth every 6  (six) hours as needed for mild pain (or Fever >/= 101).   albuterol (VENTOLIN HFA) 108 (90 Base) MCG/ACT inhaler Inhale 2 puffs into the lungs every 4 (four) hours as needed for wheezing or shortness of breath.   amiodarone (PACERONE) 100 MG tablet Take 100 mg by mouth daily.   atorvastatin (LIPITOR) 20 MG tablet Take 20 mg by mouth daily.   calcium citrate (CALCITRATE - DOSED IN MG ELEMENTAL CALCIUM) 950 MG tablet Take 1 tablet (200 mg of elemental calcium total) by mouth 2 (two) times daily.   ELIQUIS 5 MG TABS tablet Take 5 mg by mouth 2 (two) times daily.   escitalopram (LEXAPRO) 10 MG tablet Take 10 mg by mouth daily.   furosemide (LASIX) 40 MG tablet Take 1 tablet (40 mg total) by mouth 2 (two) times a week. Take on Sunday and Thursday   levothyroxine (SYNTHROID) 100 MCG tablet Take 100 mcg by mouth every morning.   losartan (COZAAR) 25 MG tablet Take 25 mg by mouth daily.   metoprolol succinate (TOPROL XL) 25 MG 24 hr tablet Take 0.5 tablets (12.5 mg total) by mouth at bedtime.   montelukast (SINGULAIR) 10 MG tablet Take 10 mg by mouth at bedtime.   potassium chloride SA (KLOR-CON M) 20 MEQ tablet Take 1 tablet (20 mEq total) by mouth daily.   traMADol (ULTRAM) 50 MG tablet Take 50 mg by mouth every 6 (six) hours as needed for pain.   triamcinolone ointment (KENALOG) 0.1 % Apply 1 application topically 3 (three) times daily as needed (irritation).      EKGs/Labs/Other Studies Reviewed:    The following studies were reviewed today:  EKG Interpretation Date/Time:  Friday March 06 2023 10:29:35 EDT Ventricular Rate:  70 PR Interval:  256 QRS Duration:  132 QT Interval:  450 QTC Calculation: 486 R Axis:   -83  Text Interpretation: Sinus rhythm with 1st degree A-V block Left axis deviation Non-specific intra-ventricular conduction block When compared with ECG of 28-Apr-2016 10:17, PR interval has increased Questionable change in QRS duration Inferior infarct is now Present  Questionable change in initial forces of Anterolateral leads Confirmed by Norman Herrlich ([id number]) on 03/06/2023 10:45:35 AM   Recent Labs: 04/02/2022: ALT 20; TSH 2.130 07/23/2022: Hemoglobin 11.6; Platelets 179 11/25/2022: BUN 27; Creatinine, Ser 1.02; NT-Pro BNP 1,453; Potassium 4.4; Sodium 146  Recent Lipid Panel    Component Value Date/Time   CHOL 140 12/18/2021 1549   TRIG 58 12/18/2021 1549   HDL 67 12/18/2021 1549   CHOLHDL 2.1 12/18/2021 1549   LDLCALC 61 12/18/2021 1549    Physical Exam:    VS:  BP 132/80 (BP Location: Left Arm, Patient Position: Sitting, Cuff Size: Normal)   Pulse 70   Ht 5\' 2"  (1.575 m)   Wt 263 lb 3.2 oz (119.4 kg)   SpO2 93%   BMI 48.14 kg/m     Wt Readings from Last 3 Encounters:  03/06/23 263 lb 3.2 oz (119.4 kg)  07/23/22 272 lb (123.4 kg)  04/24/22  272 lb (123.4 kg)     GEN:   Well nourished, well developed in no acute distress HEENT: Normal NECK: No JVD; No carotid bruits LYMPHATICS: No lymphadenopathy CARDIAC:  RRR, no murmurs, rubs, gallops RESPIRATORY:  Clear to auscultation without rales, wheezing or rhonchi  ABDOMEN: Soft, non-tender, non-distended MUSCULOSKELETAL:  No edema; No deformity  SKIN: Warm and dry NEUROLOGIC:  Alert and oriented x 3 PSYCHIATRIC:  Normal affect    Signed, Norman Herrlich, MD  03/06/2023 10:45 AM    [facility]

## 2023-03-05 NOTE — Telephone Encounter (Signed)
Patient stated she wants a call back from [employer].

## 2023-03-05 NOTE — Telephone Encounter (Signed)
Patient is feeling better after having Covid infection approximately 7 days ago. Her 6 months follow up appointment for tomorrow was cancelled.  I told her I would send message to scheduling so her appointment could be re-scheduled as long as she is asymptomatic.

## 2023-03-06 ENCOUNTER — Ambulatory Visit: Payer: PPO | Attending: Cardiology | Admitting: Cardiology

## 2023-03-06 ENCOUNTER — Encounter: Payer: Self-pay | Admitting: Cardiology

## 2023-03-06 ENCOUNTER — Ambulatory Visit: Payer: PPO | Admitting: Cardiology

## 2023-03-06 VITALS — BP 132/80 | HR 70 | Ht 62.0 in | Wt 263.2 lb

## 2023-03-06 DIAGNOSIS — I447 Left bundle-branch block, unspecified: Secondary | ICD-10-CM | POA: Diagnosis not present

## 2023-03-06 DIAGNOSIS — I4819 Other persistent atrial fibrillation: Secondary | ICD-10-CM | POA: Diagnosis not present

## 2023-03-06 DIAGNOSIS — Z7901 Long term (current) use of anticoagulants: Secondary | ICD-10-CM | POA: Diagnosis not present

## 2023-03-06 DIAGNOSIS — Z79899 Other long term (current) drug therapy: Secondary | ICD-10-CM | POA: Diagnosis not present

## 2023-03-06 DIAGNOSIS — I5042 Chronic combined systolic (congestive) and diastolic (congestive) heart failure: Secondary | ICD-10-CM

## 2023-03-06 NOTE — Patient Instructions (Signed)
Medication Instructions:  Your physician recommends that you continue on your current medications as directed. Please refer to the Current Medication list given to you today.  *If you need a refill on your cardiac medications before your next appointment, please call your pharmacy*   Lab Work: Your physician recommends that you return for lab work in:   Labs today: CMP, TSH T3 T4  If you have labs (blood work) drawn today and your tests are completely normal, you will receive your results only by: MyChart Message (if you have MyChart) OR A paper copy in the mail If you have any lab test that is abnormal or we need to change your treatment, we will call you to review the results.   Testing/Procedures: None   Follow-Up: At [facility], you and your health needs are our priority.  As part of our continuing mission to provide you with exceptional heart care, we have created designated Provider Care Teams.  These Care Teams include your primary Cardiologist (physician) and Advanced Practice Providers (APPs -  Physician Assistants and Nurse Practitioners) who all work together to provide you with the care you need, when you need it.  We recommend signing up for the patient portal called "MyChart".  Sign up information is provided on this After Visit Summary.  MyChart is used to connect with patients for Virtual Visits (Telemedicine).  Patients are able to view lab/test results, encounter notes, upcoming appointments, etc.  Non-urgent messages can be sent to your provider as well.   To learn more about what you can do with MyChart, go to [email or web address].    Your next appointment:   6 month(s)  Provider:   Wallis Bamberg, NP [facility])    Other Instructions None

## 2023-03-07 LAB — COMPREHENSIVE METABOLIC PANEL
ALT: 19 IU/L (ref 0–32)
AST: 18 IU/L (ref 0–40)
Albumin: 3.7 g/dL (ref 3.7–4.7)
Alkaline Phosphatase: 140 IU/L — ABNORMAL HIGH (ref 44–121)
BUN/Creatinine Ratio: 30 — ABNORMAL HIGH (ref 12–28)
BUN: 23 mg/dL (ref 8–27)
Bilirubin Total: 0.3 mg/dL (ref 0.0–1.2)
CO2: 21 mmol/L (ref 20–29)
Calcium: 8.7 mg/dL (ref 8.7–10.3)
Chloride: 102 mmol/L (ref 96–106)
Creatinine, Ser: 0.77 mg/dL (ref 0.57–1.00)
Globulin, Total: 2.7 g/dL (ref 1.5–4.5)
Glucose: 83 mg/dL (ref 70–99)
Potassium: 4.7 mmol/L (ref 3.5–5.2)
Sodium: 143 mmol/L (ref 134–144)
Total Protein: 6.4 g/dL (ref 6.0–8.5)
eGFR: 76 mL/min/{1.73_m2} (ref >59)

## 2023-03-07 LAB — TSH+T4F+T3FREE
Free T4: 2.04 ng/dL — ABNORMAL HIGH (ref 0.82–1.77)
T3, Free: 1.9 pg/mL — ABNORMAL LOW (ref 2.0–4.4)
TSH: 1.4 u[IU]/mL (ref 0.450–4.500)

## 2023-03-12 ENCOUNTER — Telehealth: Payer: Self-pay | Admitting: Cardiology

## 2023-03-12 NOTE — Telephone Encounter (Signed)
Pt is requesting a callback regarding her leaving her wheelchair in the waiting area before she went to the back, when she came out it was placed with the other wheelchairs and before she left she grabbed what she thought was her wheelchair but she just noticed its not the right now. So now pt is wanting to speak with nurse to see about returning the one she has and coming to pick up her correct one. Please advise

## 2023-03-17 NOTE — Telephone Encounter (Signed)
Left vm for pt to callback 

## 2023-03-17 NOTE — Telephone Encounter (Signed)
Pt states that the wheelchair she has now says [employer]. Advised that both of our wheelchairs are [employer] and are what we have currently.

## 2023-04-10 ENCOUNTER — Encounter: Payer: Self-pay | Admitting: Cardiology

## 2023-04-22 NOTE — Progress Notes (Unsigned)
Cardiology Office Note:    Date:  04/22/2023   ID:  Rebecca Hoffman, DOB 1940-12-16, MRN [id number]  PCP:  Audie Pinto, FNP  Cardiologist:  Norman Herrlich, MD    Referring MD: Audie Pinto, FNP    ASSESSMENT:    No diagnosis found. PLAN:    In order of problems listed above:  ***   Next appointment: ***   Medication Adjustments/Labs and Tests Ordered: Current medicines are reviewed at length with the patient today.  Concerns regarding medicines are outlined above.  No orders of the defined types were placed in this encounter.  No orders of the defined types were placed in this encounter.    History of Present Illness:    Rebecca Hoffman is a 82 y.o. female with a hx of paroxysmal atrial fibrillation maintaining sinus rhythm with low-dose amiodarone and chronically anticoagulated heart failure mildly reduced ejection fraction 45 to 50% hypothyroidism small pericardial effusion mitral regurgitation and left bundle branch block last seen 03/06/2023. Compliance with diet, lifestyle and medications: *** Past Medical History:  Diagnosis Date   Acquired hypothyroidism 07/26/2020   Allergic rhinitis 01/24/2020   Anxiety    Anxiety and depression 01/24/2020   Arthritis    "all over" (04/29/2016)   At high risk for falls 03/19/2022   Formatting of this note might be different from the original. Uses Rollator and WC   Chronic atrial fibrillation with RVR (HCC) 01/24/2020   Chronic combined systolic and diastolic heart failure (HCC) 03/19/2022   Formatting of this note might be different from the original. 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 Etiology is unknown. See labs 03/2022   Localized edema 03/19/2022   Formatting of this note might be different from the original. Chronic   Malaise and fatigue 07/26/2020   Mixed hyperlipidemia 03/19/2022   Moderate major depression (HCC)    Morbid obesity (HCC)    Morbid obesity with BMI of 45.0-49.9, adult (HCC) 01/24/2020   Prediabetes 03/19/2022   Primary osteoarthritis involving multiple joints 01/29/2021   Formatting of this note might be different from the original. Formatting of this note might be different from the original. "all over" (04/29/2016)   Restless legs    UTI (urinary tract infection) 04/29/2016   Noted on pre-op urine culture and u/a   Vitamin B 12 deficiency 03/22/2022   Vitamin D deficiency 04/29/2016    Current Medications: Current Meds  Medication Sig   acetaminophen (TYLENOL) 500 MG tablet Take 1-2 tablets (500-1,000 mg total) by mouth every 6 (six) hours as needed for mild pain (or Fever >/= 101).   albuterol (VENTOLIN HFA) 108 (90 Base) MCG/ACT inhaler Inhale 2 puffs into the lungs every 4 (four) hours as needed for wheezing or shortness of breath.   amiodarone (PACERONE) 100 MG tablet Take 100 mg by mouth daily.   atorvastatin (LIPITOR) 20 MG tablet Take 20 mg by mouth daily.   calcium citrate (CALCITRATE - DOSED IN MG ELEMENTAL CALCIUM) 950 MG tablet Take 1 tablet (200 mg of elemental  calcium total) by mouth 2 (two) times daily.   ELIQUIS 5 MG TABS tablet Take 5 mg by mouth 2 (two) times daily.   escitalopram (LEXAPRO) 10 MG tablet Take 10 mg by mouth daily.   furosemide (LASIX) 40 MG tablet Take 1 tablet (40 mg total) by mouth 2 (two) times a week. Take on Sunday and Thursday   levothyroxine (SYNTHROID) 100 MCG tablet Take 100 mcg by mouth every morning.   losartan (COZAAR) 25 MG tablet Take 25 mg by mouth daily.   metoprolol succinate (TOPROL XL) 25 MG 24 hr tablet Take 0.5 tablets  (12.5 mg total) by mouth at bedtime.   montelukast (SINGULAIR) 10 MG tablet Take 10 mg by mouth at bedtime.   potassium chloride SA (KLOR-CON M) 20 MEQ tablet Take 1 tablet (20 mEq total) by mouth daily.   traMADol (ULTRAM) 50 MG tablet Take 50 mg by mouth every 6 (six) hours as needed for pain.   triamcinolone ointment (KENALOG) 0.1 % Apply 1 application topically 3 (three) times daily as needed (irritation).      EKGs/Labs/Other Studies Reviewed:    The following studies were reviewed today:          Recent Labs: 07/23/2022: Hemoglobin 11.6; Platelets 179 11/25/2022: NT-Pro BNP 1,453 03/06/2023: ALT 19; BUN 23; Creatinine, Ser 0.77; Potassium 4.7; Sodium 143; TSH 1.400  Recent Lipid Panel    Component Value Date/Time   CHOL 140 12/18/2021 1549   TRIG 58 12/18/2021 1549   HDL 67 12/18/2021 1549   CHOLHDL 2.1 12/18/2021 1549   LDLCALC 61 12/18/2021 1549    Physical Exam:    VS:  There were no vitals taken for this visit.    Wt Readings from Last 3 Encounters:  03/06/23 263 lb 3.2 oz (119.4 kg)  07/23/22 272 lb (123.4 kg)  04/24/22 272 lb (123.4 kg)     GEN: *** Well nourished, well developed in no acute distress HEENT: Normal NECK: No JVD; No carotid bruits LYMPHATICS: No lymphadenopathy CARDIAC: ***RRR, no murmurs, rubs, gallops RESPIRATORY:  Clear to auscultation without rales, wheezing or rhonchi  ABDOMEN: Soft, non-tender, non-distended MUSCULOSKELETAL:  No edema; No deformity  SKIN: Warm and dry NEUROLOGIC:  Alert and oriented x 3 PSYCHIATRIC:  Normal affect    Signed, Norman Herrlich, MD  04/22/2023 1:35 PM    [facility]

## 2023-04-23 ENCOUNTER — Encounter: Payer: Self-pay | Admitting: Cardiology

## 2023-04-23 ENCOUNTER — Other Ambulatory Visit: Payer: Self-pay

## 2023-04-23 ENCOUNTER — Ambulatory Visit: Payer: PPO | Attending: Cardiology | Admitting: Cardiology

## 2023-04-23 VITALS — BP 126/74 | HR 76 | Ht 63.0 in | Wt 255.4 lb

## 2023-04-23 DIAGNOSIS — Z7901 Long term (current) use of anticoagulants: Secondary | ICD-10-CM | POA: Diagnosis not present

## 2023-04-23 DIAGNOSIS — I447 Left bundle-branch block, unspecified: Secondary | ICD-10-CM | POA: Diagnosis not present

## 2023-04-23 DIAGNOSIS — Z79899 Other long term (current) drug therapy: Secondary | ICD-10-CM

## 2023-04-23 DIAGNOSIS — I480 Paroxysmal atrial fibrillation: Secondary | ICD-10-CM

## 2023-04-23 DIAGNOSIS — I5042 Chronic combined systolic (congestive) and diastolic (congestive) heart failure: Secondary | ICD-10-CM

## 2023-04-23 NOTE — Patient Instructions (Signed)
Medication Instructions:  Your physician has recommended you make the following change in your medication:   START: Bumex 2 mg daily  *If you need a refill on your cardiac medications before your next appointment, please call your pharmacy*   Lab Work: None If you have labs (blood work) drawn today and your tests are completely normal, you will receive your results only by: MyChart Message (if you have MyChart) OR A paper copy in the mail If you have any lab test that is abnormal or we need to change your treatment, we will call you to review the results.   Testing/Procedures: None   Follow-Up: At [facility], you and your health needs are our priority.  As part of our continuing mission to provide you with exceptional heart care, we have created designated Provider Care Teams.  These Care Teams include your primary Cardiologist (physician) and Advanced Practice Providers (APPs -  Physician Assistants and Nurse Practitioners) who all work together to provide you with the care you need, when you need it.  We recommend signing up for the patient portal called "MyChart".  Sign up information is provided on this After Visit Summary.  MyChart is used to connect with patients for Virtual Visits (Telemedicine).  Patients are able to view lab/test results, encounter notes, upcoming appointments, etc.  Non-urgent messages can be sent to your provider as well.   To learn more about what you can do with MyChart, go to [email or web address].    Your next appointment:   6 month(s)  Provider:   Norman Herrlich, MD    Other Instructions None

## 2023-06-16 ENCOUNTER — Encounter: Payer: Self-pay | Admitting: Cardiology

## 2023-07-03 ENCOUNTER — Ambulatory Visit: Payer: PPO

## 2023-07-03 ENCOUNTER — Ambulatory Visit: Payer: PPO | Admitting: Podiatry

## 2023-07-03 DIAGNOSIS — M778 Other enthesopathies, not elsewhere classified: Secondary | ICD-10-CM | POA: Diagnosis not present

## 2023-07-03 DIAGNOSIS — S9032XA Contusion of left foot, initial encounter: Secondary | ICD-10-CM | POA: Diagnosis not present

## 2023-07-03 DIAGNOSIS — M79675 Pain in left toe(s): Secondary | ICD-10-CM

## 2023-07-03 DIAGNOSIS — M79674 Pain in right toe(s): Secondary | ICD-10-CM | POA: Diagnosis not present

## 2023-07-03 DIAGNOSIS — L84 Corns and callosities: Secondary | ICD-10-CM | POA: Diagnosis not present

## 2023-07-03 DIAGNOSIS — B351 Tinea unguium: Secondary | ICD-10-CM | POA: Diagnosis not present

## 2023-07-03 NOTE — Progress Notes (Unsigned)
Subjective:  Patient ID: Rebecca Hoffman, female    DOB: Apr 20, 1941,  MRN: [id number]  Rebecca Hoffman presents to clinic today for:  Chief Complaint  Patient presents with   Foot Pain    Dropped ice cream on top of her foot from freezer.   rfc    RFC NOT DIABETIC ELIQUIS   Patient notes nails are thick, discolored, elongated and painful in shoegear when trying to ambulate.  She notes she has a hard piece of skin on the plantar aspect the left fifth toe.  She thinks this might be a wart.  She states someone told her there was a staple in her toe.  She also dropped a large plant of ice cream on top of the left foot approximately 6 weeks ago.  She had x-rays taken.  There is a previous report from an outside facility noting no fracture but no images could be seen today.  PCP is Goins, Gwenith Spitz, FNP.  Past Medical History:  Diagnosis Date   Acquired hypothyroidism 07/26/2020   Allergic rhinitis 01/24/2020   Anxiety    Anxiety and depression 01/24/2020   Arthritis    "all over" (04/29/2016)   At high risk for falls 03/19/2022   Formatting of this note might be different from the original. Uses Rollator and WC   Chronic atrial fibrillation with RVR (HCC) 01/24/2020   Chronic combined systolic and diastolic heart failure (HCC) 03/19/2022   Formatting of this note might be different from the original. 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 Etiology is unknown. See labs 03/2022   Localized edema 03/19/2022   Formatting of this note might be different from the original. Chronic    Malaise and fatigue 07/26/2020   Mixed hyperlipidemia 03/19/2022   Moderate major depression (HCC)    Morbid obesity (HCC)    Morbid obesity with BMI of 45.0-49.9, adult (HCC) 01/24/2020   Prediabetes 03/19/2022   Primary osteoarthritis involving multiple joints 01/29/2021   Formatting of this note might be different from the original. Formatting of this note might be different from the original. "all over" (04/29/2016)   Restless legs    UTI (urinary tract infection) 04/29/2016   Noted on pre-op urine culture and u/a   Vitamin B 12 deficiency 03/22/2022   Vitamin D deficiency 04/29/2016    Past Surgical History:  Procedure Laterality Date   CATARACT EXTRACTION W/ INTRAOCULAR LENS IMPLANT Left 2014   FRACTURE SURGERY     ORIF HUMERUS FRACTURE Right 04/28/2016   Procedure: OPEN REDUCTION INTERNAL FIXATION (ORIF) RIGHT PROXIMAL HUMERUS FRACTURE;  Surgeon: Myrene Galas, MD;  Location: [facility] OR;  Service: Orthopedics;  Laterality: Right;    Allergies  Allergen Reactions   Penicillins Shortness Of Breath    FLUSHING "made face red"    Review of Systems: Negative except as noted in the HPI.  Objective:  Rebecca Hoffman is a pleasant 82 y.o. female in NAD. AAO x 3.  Vascular Examination: Capillary refill time is 3-5 seconds to toes bilateral. Palpable pedal  pulses b/l LE. Digital hair present b/l.  Skin temperature gradient WNL b/l. No varicosities b/l. No cyanosis noted b/l.   Dermatological Examination: Pedal skin with normal turgor, texture and tone b/l. No open wounds. No interdigital macerations b/l. Toenails x10 are 3mm thick, discolored, dystrophic with subungual debris. There is pain with compression of the nail plates.  They are elongated x10.  There is a small hard ledge of skin from the way the toe was positioned on the plantar aspect of the left fifth toe.  No evidence of foreign body is noted.  Neurological Examination: Protective sensation intact bilateral LE. Vibratory sensation  intact bilateral LE.  Musculoskeletal Examination: Muscle strength 5/5 to all LE muscle groups b/l.  Adductovarus left fifth toe.  No pain on palpation to the dorsal foot.  Radiographic evaluation: 3 partial weightbearing images of the left foot including DP, medial bleak, lateral views were negative for any foreign body or staple near the fifth toe.  There is medial angulation of the fifth toe at the IP joint.  There is a short first metatarsal with a enlarged bony eminence at the dorsal medial aspect of the first met head.  There is uneven joint space narrowing at the first MPJ.  There is an inferior and posterior calcaneal spur noted.  Assessment/Plan: 1. Capsulitis of foot, left   2. Contusion of left foot, initial encounter   3. Pain due to onychomycosis of toenails of both feet   4. Callus of foot    The mycotic toenails were sharply debrided x10 with sterile nail nippers and a power debriding burr to decrease bulk/thickness and length.    Informed the patient that no foreign body could be seen near the left fifth toe.  Informed her that the way the toe is curled inward it is creating a ledge of skin that will dry out and form hard edge of callus tissue.  This was sanded away with a power bur.  Her skin folds make it difficult to shaved with a scalpel blade.  Reviewed the x-rays with the patient today informed her no evidence of fracture noted to the foot from the contusion.  Recommend follow-up in 3 months for routine footcare   Tamela Elsayed D. Grenda Lora, DPM, FACFAS [facility]     [address] [place], [place] [address]                Office [phone]  Fax [phone]

## 2023-07-09 ENCOUNTER — Other Ambulatory Visit: Payer: Self-pay | Admitting: Cardiology

## 2023-07-16 ENCOUNTER — Telehealth: Payer: Self-pay | Admitting: Cardiology

## 2023-07-16 NOTE — Telephone Encounter (Signed)
 Patient called and said that she was responding to mychart message:  I had another accident. I had something to shoot out of the freezer and hit me on the top of the foot and it came up a real hard place when it happened. It's been almost 3 weeks. I went to get it x-rayed, and the man said it was not broke, but That knot is still there and it was very painful last night Dr. Thurmond can't see me until 22 January. I need somebody to tell me if it is a blood clot. I don't know what to do

## 2023-07-16 NOTE — Telephone Encounter (Signed)
 Patient stated that she was calling to follow up on a my chart message that was sent. She was unsure who the message was from or what the message stated.  Patient had no questions or concerns at this time.

## 2023-08-07 ENCOUNTER — Encounter: Payer: Self-pay | Admitting: Cardiology

## 2023-08-16 NOTE — Progress Notes (Unsigned)
Cardiology Office Note:    Date:  08/17/2023   ID:  Rebecca Hoffman, DOB Jan 27, 1941, MRN [id number]  PCP:  Audie Pinto, FNP  Cardiologist:  Norman Herrlich, MD    Referring MD: Audie Pinto, FNP    ASSESSMENT:    1. Paroxysmal atrial fibrillation (HCC)   2. On amiodarone therapy   3. Chronic anticoagulation   4. Chronic combined systolic and diastolic heart failure (HCC)   5. LBBB (left bundle branch block)   6. Nonrheumatic mitral valve regurgitation    PLAN:    In order of problems listed above:  Doing well low-dose amiodarone maintaining sinus rhythm no signs of toxicity I will go ahead and check labs today including a CMP thyroids continue amiodarone 100 mg a day and her current anticoagulant Well compensated she will continue her current loop diuretic Bumex 2 mg daily along with her beta-blocker and losartan. Fortunately she has a smart watch monitoring her rhythm and has not had syncope.  Stable pattern   Next appointment: 6 months   Medication Adjustments/Labs and Tests Ordered: Current medicines are reviewed at length with the patient today.  Concerns regarding medicines are outlined above.  Orders Placed This Encounter  Procedures   EKG 12-Lead   No orders of the defined types were placed in this encounter.    History of Present Illness:    Rebecca Hoffman is a 83 y.o. female with a hx of paroxysmal atrial fibrillation maintaining sinus rhythm with low-dose amiodarone and chronic anticoagulation heart failure with mildly reduced ejection fraction 45 to 50% associated left bundle branch block small pericardial effusion hypothyroidism last seen 04/23/2023.  Compliance with diet, lifestyle and medications: Yes  She seems to be doing well she has a smart watch she has had no episodes of atrial fibrillation she tolerates her anticoagulant without bleeding and is not having edema shortness of breath palpitation or syncope.  She lives independently but has a driver to  bring her to the office she uses a walker when she is outdoors but not inside of her home. Past Medical History:  Diagnosis Date   Acquired hypothyroidism 07/26/2020   Allergic rhinitis 01/24/2020   Anxiety    Anxiety and depression 01/24/2020   Arthritis    "all over" (04/29/2016)   At high risk for falls 03/19/2022   Formatting of this note might be different from the original. Uses Rollator and WC   Chronic atrial fibrillation with RVR (HCC) 01/24/2020   Chronic combined systolic and diastolic heart failure (HCC) 03/19/2022   Formatting of this note might be different from the original. 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 Etiology is unknown. See labs 03/2022   Localized edema 03/19/2022   Formatting of this note might be different from the original. Chronic   Malaise and fatigue 07/26/2020   Mixed hyperlipidemia 03/19/2022   Moderate major depression (HCC)    Morbid obesity (HCC)    Morbid obesity with BMI of 45.0-49.9, adult (HCC) 01/24/2020   Prediabetes 03/19/2022   Primary osteoarthritis  involving multiple joints 01/29/2021   Formatting of this note might be different from the original. Formatting of this note might be different from the original. "all over" (04/29/2016)   Restless legs    UTI (urinary tract infection) 04/29/2016   Noted on pre-op urine culture and u/a   Vitamin B 12 deficiency 03/22/2022   Vitamin D deficiency 04/29/2016    Current Medications: Current Meds  Medication Sig   acetaminophen (TYLENOL) 500 MG tablet Take 1-2 tablets (500-1,000 mg total)  by mouth every 6 (six) hours as needed for mild pain (or Fever >/= 101).   albuterol (VENTOLIN HFA) 108 (90 Base) MCG/ACT inhaler Inhale 2 puffs into the lungs every 4 (four) hours as needed for wheezing or shortness of breath.   alum & mag hydroxide-simeth (MAALOX/MYLANTA) 200-200-20 MG/5 SUSP Apply 30 mLs topically as needed (Indigestion).   amiodarone (PACERONE) 100 MG tablet Take 100 mg by mouth daily.   bumetanide (BUMEX) 2 MG tablet Take 2 mg by mouth daily.   ELIQUIS 5 MG TABS tablet Take 5 mg by mouth 2 (two) times daily.   escitalopram (LEXAPRO) 10 MG tablet Take 10 mg by mouth daily.   levothyroxine (SYNTHROID) 100 MCG tablet Take 150 mcg by mouth every morning.   losartan (COZAAR) 25 MG tablet Take 25 mg by mouth daily.   metoprolol succinate (TOPROL XL) 25 MG 24 hr tablet Take 0.5 tablets (12.5 mg total) by mouth at bedtime.   montelukast (SINGULAIR) 10 MG tablet Take 10 mg by mouth daily.   ondansetron (ZOFRAN) 4 MG tablet Take 4 mg by mouth every 8 (eight) hours as needed for nausea or vomiting.   potassium chloride SA (KLOR-CON M) 20 MEQ tablet Take 1 tablet (20 mEq total) by mouth daily.   QUEtiapine (SEROQUEL) 25 MG tablet Take 25 mg by mouth daily.   rOPINIRole (REQUIP) 1 MG tablet Take 1 mg by mouth at bedtime.   Vitamin D, Cholecalciferol, 25 MCG (1000 UT) TABS Take 2 tablets by mouth daily.      EKGs/Labs/Other Studies Reviewed:    The following studies were reviewed today:      EKG Interpretation Date/Time:  Monday August 17 2023 16:19:46 EST Ventricular Rate:  75 PR Interval:  238 QRS Duration:  148 QT Interval:  464 QTC Calculation: 518 R Axis:   263  Text Interpretation: Sinus rhythm with 1st degree A-V block with Premature atrial complexes Right bundle branch block  LAHB When compared with ECG of 23-Apr-2023 13:30, Premature atrial complexes are now Present Confirmed by Norman Herrlich ([id number]) on 08/17/2023 4:36:13 PM   Recent Labs: 11/25/2022: NT-Pro BNP  1,453 03/06/2023: ALT 19; BUN 23; Creatinine, Ser 0.77; Potassium 4.7; Sodium 143; TSH 1.400  Recent Lipid Panel    Component Value Date/Time   CHOL 140 12/18/2021 1549   TRIG 58 12/18/2021 1549   HDL 67 12/18/2021 1549   CHOLHDL 2.1 12/18/2021 1549   LDLCALC 61 12/18/2021 1549    Physical Exam:    VS:  BP (!) 160/80   Pulse 75   Ht 5\' 3"  (1.6 m)   Wt 257 lb 12.8 oz (116.9 kg)   SpO2 94%   BMI 45.67 kg/m     Wt Readings from Last 3 Encounters:  08/17/23 257 lb 12.8 oz (116.9 kg)  04/23/23 255 lb 6.4 oz (115.8 kg)  03/06/23 263 lb 3.2 oz (119.4 kg)     GEN: Quite obese BMI exceeds 45 well nourished, well developed in no  acute distress HEENT: Normal NECK: No JVD; No carotid bruits LYMPHATICS: No lymphadenopathy CARDIAC: RRR, no murmurs, rubs, gallops RESPIRATORY:  Clear to auscultation without rales, wheezing or rhonchi  ABDOMEN: Soft, non-tender, non-distended MUSCULOSKELETAL:  No edema; No deformity  SKIN: Warm and dry NEUROLOGIC:  Alert and oriented x 3 PSYCHIATRIC:  Normal affect    Signed, Norman Herrlich, MD  08/17/2023 4:47 PM    [facility]

## 2023-08-17 ENCOUNTER — Encounter: Payer: Self-pay | Admitting: Cardiology

## 2023-08-17 ENCOUNTER — Ambulatory Visit: Payer: PPO | Attending: Cardiology | Admitting: Cardiology

## 2023-08-17 VITALS — BP 160/80 | HR 75 | Ht 63.0 in | Wt 257.8 lb

## 2023-08-17 DIAGNOSIS — Z79899 Other long term (current) drug therapy: Secondary | ICD-10-CM

## 2023-08-17 DIAGNOSIS — I5042 Chronic combined systolic (congestive) and diastolic (congestive) heart failure: Secondary | ICD-10-CM | POA: Diagnosis not present

## 2023-08-17 DIAGNOSIS — I340 Nonrheumatic mitral (valve) insufficiency: Secondary | ICD-10-CM

## 2023-08-17 DIAGNOSIS — Z7901 Long term (current) use of anticoagulants: Secondary | ICD-10-CM | POA: Diagnosis not present

## 2023-08-17 DIAGNOSIS — I480 Paroxysmal atrial fibrillation: Secondary | ICD-10-CM

## 2023-08-17 DIAGNOSIS — I447 Left bundle-branch block, unspecified: Secondary | ICD-10-CM

## 2023-08-17 DIAGNOSIS — I452 Bifascicular block: Secondary | ICD-10-CM

## 2023-08-17 NOTE — Patient Instructions (Signed)
Medication Instructions:  Your physician recommends that you continue on your current medications as directed. Please refer to the Current Medication list given to you today.  *If you need a refill on your cardiac medications before your next appointment, please call your pharmacy*   Lab Work: Your physician recommends that you return for lab work in:   Labs today: CMP, TSH T3 T4  If you have labs (blood work) drawn today and your tests are completely normal, you will receive your results only by: MyChart Message (if you have MyChart) OR A paper copy in the mail If you have any lab test that is abnormal or we need to change your treatment, we will call you to review the results.   Testing/Procedures: None   Follow-Up: At [facility], you and your health needs are our priority.  As part of our continuing mission to provide you with exceptional heart care, we have created designated Provider Care Teams.  These Care Teams include your primary Cardiologist (physician) and Advanced Practice Providers (APPs -  Physician Assistants and Nurse Practitioners) who all work together to provide you with the care you need, when you need it.  We recommend signing up for the patient portal called "MyChart".  Sign up information is provided on this After Visit Summary.  MyChart is used to connect with patients for Virtual Visits (Telemedicine).  Patients are able to view lab/test results, encounter notes, upcoming appointments, etc.  Non-urgent messages can be sent to your provider as well.   To learn more about what you can do with MyChart, go to [email or web address].    Your next appointment:   6 month(s)  Provider:   Shirlee More, MD    Other Instructions None

## 2023-08-22 LAB — COMPREHENSIVE METABOLIC PANEL
ALT: 13 [IU]/L (ref 0–32)
AST: 15 [IU]/L (ref 0–40)
Albumin: 3.7 g/dL (ref 3.7–4.7)
Alkaline Phosphatase: 133 [IU]/L — ABNORMAL HIGH (ref 44–121)
BUN/Creatinine Ratio: 26 (ref 12–28)
BUN: 20 mg/dL (ref 8–27)
Bilirubin Total: 0.3 mg/dL (ref 0.0–1.2)
CO2: 22 mmol/L (ref 20–29)
Calcium: 8.8 mg/dL (ref 8.7–10.3)
Chloride: 104 mmol/L (ref 96–106)
Creatinine, Ser: 0.77 mg/dL (ref 0.57–1.00)
Globulin, Total: 2.8 g/dL (ref 1.5–4.5)
Glucose: 88 mg/dL (ref 70–99)
Potassium: 4.5 mmol/L (ref 3.5–5.2)
Sodium: 142 mmol/L (ref 134–144)
Total Protein: 6.5 g/dL (ref 6.0–8.5)
eGFR: 76 mL/min/{1.73_m2} (ref >59)

## 2023-08-22 LAB — TSH+T4F+T3FREE
Free T4: 2.06 ng/dL — ABNORMAL HIGH (ref 0.82–1.77)
T3, Free: 2.3 pg/mL (ref 2.0–4.4)
TSH: 0.402 u[IU]/mL — ABNORMAL LOW (ref 0.450–4.500)

## 2023-08-25 ENCOUNTER — Other Ambulatory Visit: Payer: Self-pay

## 2023-08-25 DIAGNOSIS — Z79899 Other long term (current) drug therapy: Secondary | ICD-10-CM

## 2023-09-13 ENCOUNTER — Telehealth: Payer: Self-pay | Admitting: Cardiology

## 2023-09-13 NOTE — Telephone Encounter (Signed)
 Patient called the answering service this afternoon concerned about 2 spots on her leg that she believes are cancerous.  She was seen by her PCP a few weeks ago for evaluation of these 2 spots and was started on antibiotics.  Reports that the spots did not go away.  She would like to discuss with Dr. Dulce Sellar.  Discussed that to confirm a diagnosis of skin cancer, she will likely need a biopsy.  Discussed that this is not done through cardiology practice.  I recommended that she reach out to discuss with her PCP or establish with dermatology practice.  Patient believes that her primary care provider will not help her with this concern.  She really wants to discuss with Dr. Dulce Sellar.  I again told patient that a cardiology practice will likely not have the resources she needs to get skin lesions diagnosed and treated properly.  She says that in the past Dr. Dulce Sellar has referred her to other specialties and treated her for bronchitis and other noncardiac issues.  I agreed to pass the message along to Dr. Dulce Sellar.

## 2023-09-14 ENCOUNTER — Ambulatory Visit (HOSPITAL_BASED_OUTPATIENT_CLINIC_OR_DEPARTMENT_OTHER): Admission: EM | Admit: 2023-09-14 | Discharge: 2023-09-14 | Disposition: A | Discharge status: Home / Self Care

## 2023-09-14 ENCOUNTER — Other Ambulatory Visit: Payer: Self-pay | Admitting: Family Medicine

## 2023-09-14 ENCOUNTER — Encounter (HOSPITAL_BASED_OUTPATIENT_CLINIC_OR_DEPARTMENT_OTHER): Payer: Self-pay | Admitting: Emergency Medicine

## 2023-09-14 DIAGNOSIS — L989 Disorder of the skin and subcutaneous tissue, unspecified: Secondary | ICD-10-CM

## 2023-09-14 NOTE — Discharge Instructions (Signed)
 I alerted the patient that she has quite a bit of bruising but she is on Eliquis, a blood thinner and I believe this is common.  She also has pitting edema or swelling of both lower legs.  And that increases the vascular pressure and makes it easier for her to have sores that take time to heal.  All of the places on her left thigh and right lower leg that she feels her cancer, I believe they are simply crusted sores because she has scratched them.  She is very concerned that they are not healing.  I was able to contact [facility] dermatology and get her an appointment for Friday, 09/18/2023.  Follow-up with [facility] dermatology regarding these lesions.

## 2023-09-14 NOTE — ED Provider Notes (Signed)
 [facility]    CSN: [id number] Arrival date & time: 09/14/23  1528      History   Chief Complaint No chief complaint on file.   HPI Rebecca Hoffman is a 83 y.o. female.   Pt has multiple sores to her bilateral legs. She has been to other urgent care and was given a antibiotic but they never completley healed. Pt reports first sore appeared in the early fall of 2024, after she left the nursing home.  She has multiple crusted areas that she feels do not heal and must be skin cancers.  She is on a blood thinner and also has lots of bruises on her legs.     Past Medical History:  Diagnosis Date   Acquired hypothyroidism 07/26/2020   Allergic rhinitis 01/24/2020   Anxiety    Anxiety and depression 01/24/2020   Arthritis    "all over" (04/29/2016)   At high risk for falls 03/19/2022   Formatting of this note might be different from the original. Uses Rollator and WC   Chronic atrial fibrillation with RVR (HCC) 01/24/2020   Chronic combined systolic and diastolic heart failure (HCC) 03/19/2022   Formatting of this note might be different from the original. 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 Etiology is unknown. See labs 03/2022   Localized edema 03/19/2022   Formatting of this note might be different from the original. Chronic   Malaise and fatigue 07/26/2020   Mixed hyperlipidemia 03/19/2022   Moderate major depression (HCC)    Morbid obesity (HCC)    Morbid obesity with BMI of 45.0-49.9, adult (HCC) 01/24/2020   Prediabetes 03/19/2022   Primary osteoarthritis  involving multiple joints 01/29/2021   Formatting of this note might be different from the original. Formatting of this note might be different from the original. 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   Primary osteoarthritis involving multiple joints 01/29/2021   Restless legs    Moderate major depression (HCC)    Chronic hip pain    Arthritis    Acquired hypothyroidism 07/26/2020   Malaise and fatigue 07/26/2020   Allergic rhinitis 01/24/2020   Anxiety and depression 01/24/2020   Chronic atrial fibrillation with RVR (HCC) 01/24/2020   GERD without esophagitis 01/24/2020   Morbid obesity with BMI of 45.0-49.9, adult (HCC) 01/24/2020   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      Home Medications    Prior to Admission medications   Medication Sig Start Date End Date Taking? Authorizing Provider  amiodarone (PACERONE) 100 MG tablet Take 100 mg by mouth daily.   Yes [provider]  bumetanide (BUMEX) 2 MG tablet Take 2 mg by mouth daily.   Yes [provider]  ELIQUIS 5 MG TABS tablet Take 5 mg by mouth 2 (two) times daily. 07/26/20  Yes [provider]  levothyroxine (SYNTHROID) 100 MCG tablet Take 150 mcg by mouth every morning. 07/27/20  Yes [provider]  losartan (COZAAR) 25 MG tablet Take 25 mg by mouth daily. 07/27/20  Yes [provider]  montelukast (SINGULAIR) 10 MG tablet Take 10 mg by mouth daily. 07/10/23  Yes [provider]  potassium chloride SA (KLOR-CON M) 20 MEQ tablet Take 1 tablet (20 mEq total) by mouth daily. 09/24/21  Yes Baldo Daub, MD  rOPINIRole (REQUIP) 1 MG tablet Take 1 mg by mouth at bedtime.   Yes [provider]  Vitamin D, Cholecalciferol, 25 MCG (1000 UT) TABS Take 2 tablets by mouth daily.   Yes [provider]  acetaminophen (TYLENOL) 500 MG tablet Take 1-2 tablets (500-1,000 mg total) by mouth every 6 (six) hours as needed for mild pain (or Fever >/= 101). 04/29/16   Montez Morita, PA-C  albuterol (VENTOLIN HFA) 108 (90 Base) MCG/ACT inhaler Inhale 2 puffs into the lungs every 4 (four) hours as needed for wheezing or shortness of breath.    [provider]  alum & mag hydroxide-simeth (MAALOX/MYLANTA) 200-200-20 MG/5 SUSP Apply 30 mLs topically as needed (Indigestion).    [provider]  escitalopram (LEXAPRO) 10 MG tablet Take 10 mg by mouth daily. 07/27/20   [provider]  metoprolol succinate (TOPROL XL) 25 MG 24 hr tablet Take 0.5 tablets (12.5 mg total) by mouth at bedtime. 07/23/22   Baldo Daub, MD  ondansetron (ZOFRAN) 4 MG tablet Take 4 mg by mouth every 8 (eight) hours as needed for nausea or vomiting.     [provider]  QUEtiapine (SEROQUEL) 25 MG tablet Take 25 mg by mouth daily. 03/26/23   [provider]    Family History Family History  Problem Relation Age of Onset   Coronary artery disease Father    Heart attack Father    Hypertension Mother    Diverticulitis Mother    Breast cancer Sister    Coronary artery disease Brother        CABG    Social History Social History   Tobacco Use   Smoking status: Never   Smokeless tobacco: Never  Vaping Use   Vaping status: Never Used  Substance Use Topics   Alcohol use: No   Drug use: No     Allergies   Penicillins   Review of Systems Review of Systems  Constitutional:  Negative for chills and fever.  HENT:  Negative for ear pain and sore throat.   Eyes:  Negative for pain and visual disturbance.  Respiratory:  Negative for cough and shortness of breath.   Cardiovascular:  Negative for chest pain and palpitations.  Gastrointestinal:  Negative for abdominal pain, constipation, diarrhea, nausea and vomiting.  Genitourinary:  Negative for dysuria and hematuria.  Musculoskeletal:  Negative for arthralgias and back pain.  Skin:  Positive for wound (crusted areas on both legs.). Negative for color change and rash.  Neurological:  Negative for seizures and syncope.  Hematological:  Bruises/bleeds easily (but she is on a blood thinner).  All other systems reviewed and are negative.    Physical Exam Triage Vital Signs ED Triage Vitals  Encounter Vitals Group     BP 09/14/23 1615 (!) 174/71     Systolic BP Percentile --      Diastolic BP Percentile --      Pulse Rate 09/14/23 1615 64     Resp 09/14/23 1615 20     Temp 09/14/23 1615 97.9 F (36.6 C)     Temp Source 09/14/23 1615 Oral     SpO2 09/14/23 1615 90 %     Weight --      Height --      Head Circumference --      Peak Flow --      Pain Score 09/14/23 1614 0     Pain Loc --      Pain Education --      Exclude from Growth Chart --    No  data found.  Updated Vital Signs BP (!) 174/71 (BP Location: Right Arm)   Pulse 64   Temp 97.9 F (36.6 C) (Oral)   Resp 20   SpO2 90%   Visual Acuity Right Eye Distance:   Left Eye Distance:   Bilateral Distance:    Right Eye Near:   Left Eye Near:    Bilateral Near:     Physical Exam Vitals and nursing note reviewed.  Constitutional:      General: She is not in acute distress.    Appearance: She is well-developed. She is morbidly obese. She is not ill-appearing or toxic-appearing.  HENT:     Head: Normocephalic and atraumatic.     Right Ear: External ear normal.     Left Ear: External ear normal.     Nose: Nose normal. No congestion or rhinorrhea.     Right Sinus: No maxillary sinus tenderness or frontal sinus tenderness.     Left Sinus: No maxillary sinus tenderness or frontal sinus tenderness.     Mouth/Throat:     Lips: Pink.     Mouth: Mucous membranes are moist.  Eyes:     Conjunctiva/sclera: Conjunctivae normal.     Pupils: Pupils are equal, round, and reactive to light.  Cardiovascular:     Rate and Rhythm: Normal rate and regular rhythm.     Heart sounds: S1 normal and S2 normal. No murmur heard. Pulmonary:     Effort: Pulmonary effort is normal. No respiratory distress.     Breath sounds: Normal breath sounds. No decreased breath sounds, wheezing, rhonchi or rales.  Musculoskeletal:        General: No swelling.     Cervical back: Neck supple.  Skin:    General: Skin is warm and dry.     Capillary Refill: Capillary refill takes less than 2 seconds.  Findings: Lesion (Left upper thigh with 2 crusted areas that appear to be scabs from the patient scratching her legs.  Multiple areas of ecchymosis on the left leg.  Right leg with 2 separate areas below the knee that are crusted and scabbed.) present. No rash.  Neurological:     Mental Status: She is alert and oriented to person, place, and time.  Psychiatric:        Mood and Affect: Mood normal.       UC Treatments / Results  Labs (all labs ordered are listed, but only abnormal results are displayed) Labs Reviewed - No data to display  EKG   Radiology No results found.  Procedures Procedures (including critical care time)  Medications Ordered in UC Medications - No data to display  Initial Impression / Assessment and Plan / UC Course  I have reviewed the triage vital signs and the nursing notes.  Pertinent labs & imaging results that were available during my care of the patient were reviewed by me and considered in my medical decision making (see chart for details).     Please see notes in patient's discharge instructions.  She has areas on her left and her right lower leg that she believes are skin cancers.  They appear to need to be crusted areas that she has scratched.  However I have secured her an appointment with [facility] dermatology for further evaluation of these lesions or sores.  Follow-up with [facility] on 09/18/2023 at 9:00 AM as scheduled. Final Clinical Impressions(s) / UC Diagnoses   Final diagnoses:  Skin lesion of left leg  Skin lesion of right leg     Discharge Instructions      I alerted the patient that she has quite a bit of bruising but she is on Eliquis, a blood thinner and I believe this is common.  She also has pitting edema or swelling of both lower legs.  And that increases the vascular pressure and makes it easier for her to have sores that take time to heal.  All of the places on her left thigh and right lower leg that she feels her cancer, I believe they are simply crusted sores because she has scratched them.  She is very concerned that they are not healing.  I was able to contact [facility] dermatology and get her an appointment for Friday, 09/18/2023.  Follow-up with [facility] dermatology regarding these lesions.     ED Prescriptions   None    PDMP not reviewed this encounter.   Prescilla Sours, FNP 09/14/23 [phone]

## 2023-09-14 NOTE — ED Triage Notes (Signed)
 Pt has multiple sores to her bilateral legs. She has been to other urgent care and was given a antibiotic but they never completley healed. Pt reports first sore appeared 6 months ago after she left the nursing home.

## 2023-09-15 LAB — TSH+T4F+T3FREE
Free T4: 2 ng/dL — ABNORMAL HIGH (ref 0.82–1.77)
T3, Free: 2.3 pg/mL (ref 2.0–4.4)
TSH: 0.642 u[IU]/mL (ref 0.450–4.500)

## 2023-10-01 ENCOUNTER — Ambulatory Visit: Payer: PPO | Admitting: Podiatry

## 2023-10-08 ENCOUNTER — Ambulatory Visit (INDEPENDENT_AMBULATORY_CARE_PROVIDER_SITE_OTHER): Admitting: Podiatry

## 2023-10-08 DIAGNOSIS — M79675 Pain in left toe(s): Secondary | ICD-10-CM | POA: Diagnosis not present

## 2023-10-08 DIAGNOSIS — M79674 Pain in right toe(s): Secondary | ICD-10-CM

## 2023-10-08 DIAGNOSIS — B351 Tinea unguium: Secondary | ICD-10-CM

## 2023-10-08 DIAGNOSIS — L84 Corns and callosities: Secondary | ICD-10-CM

## 2023-10-08 DIAGNOSIS — I739 Peripheral vascular disease, unspecified: Secondary | ICD-10-CM

## 2023-10-08 NOTE — Progress Notes (Signed)
 Subjective:  Patient ID: Rebecca Hoffman, female    DOB: 02/27/1941,  MRN: [id number]  Rebecca Hoffman presents to clinic today for:  Chief Complaint  Patient presents with   [facility]     RFC with callous under the left 5th toe. Not diabetic and no anti coag.    Patient notes nails are thick and elongated, causing pain in shoe gear when ambulating.  She has multiple corns and calluses bilateral.  Once we discovered that she wanted her corns and calluses shaved, we reviewed and the patient signed an ABN since Medicaid is her secondary and they do not cover any corn or callus shaving.  PCP is Goins, Gwenith Spitz, FNP.  Last seen 03/31/2023  Past Medical History:  Diagnosis Date   Acquired hypothyroidism 07/26/2020   Allergic rhinitis 01/24/2020   Anxiety    Anxiety and depression 01/24/2020   Arthritis    "all over" (04/29/2016)   At high risk for falls 03/19/2022   Formatting of this note might be different from the original. Uses Rollator and WC   Chronic atrial fibrillation with RVR (HCC) 01/24/2020   Chronic combined systolic and diastolic heart failure (HCC) 03/19/2022   Formatting of this note might be different from the original. 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 Etiology is unknown. See labs 03/2022   Localized edema 03/19/2022   Formatting of this note might be different from the original. Chronic   Malaise and fatigue 07/26/2020   Mixed hyperlipidemia 03/19/2022   Moderate major depression (HCC)    Morbid obesity (HCC)    Morbid obesity with BMI of 45.0-49.9, adult (HCC)  01/24/2020   Prediabetes 03/19/2022   Primary osteoarthritis involving multiple joints 01/29/2021   Formatting of this note might be different from the original. Formatting of this note might be different from the original. "all over" (04/29/2016)   Restless legs    UTI (urinary tract infection) 04/29/2016   Noted on pre-op urine culture and u/a   Vitamin B 12 deficiency 03/22/2022   Vitamin D deficiency 04/29/2016   Allergies  Allergen Reactions   Penicillins Shortness Of Breath    FLUSHING "made face red"   Objective:  ELIYANAH ELGERSMA is a pleasant 83 y.o. female in NAD. AAO x 3.  Vascular Examination: Patient has palpable DP pulse, absent PT pulse bilateral.  Delayed capillary refill bilateral toes.  Sparse digital hair bilateral.  Proximal to distal cooling WNL bilateral.    Dermatological Examination: Interspaces are clear with no open lesions noted bilateral.  Skin is shiny and atrophic bilateral.  Nails are 3-27mm thick, with yellowish/brown discoloration, subungual debris and distal onycholysis x10.  There is pain with compression of nails x10.  There are hyperkeratotic lesions noted submet 3 bilateral, left distal lateral fifth toe, medial left fifth toe DIPJ, plantar left fifth toe/fourth interspace, and 2 focal corns on the plantar left heel.  Patient qualifies for at-risk foot care because of PVD.  Assessment/Plan: 1. Pain due to onychomycosis of toenails of both feet  2. Corns   3. PVD (peripheral vascular disease) (HCC)    Mycotic nails x10 were sharply debrided with sterile nail nippers and power debriding burr to decrease bulk and length.  Hyperkeratotic lesions x 7 were shaved with #312 blade.  Four of the seven lesions are proximal to the toe DIPJ's and therefore billable.  A gel double buddy toe loop was dispensed to decrease pressure between the toes.   Return in about 3 months (around 01/08/2024) for RFC.   Clerance Lav, DPM, FACFAS [facility]     [address] [place], [place] [address]                Office [phone]  Fax [phone]

## 2023-10-12 ENCOUNTER — Other Ambulatory Visit: Payer: Self-pay | Admitting: Cardiology

## 2023-10-13 ENCOUNTER — Encounter: Payer: Self-pay | Admitting: Cardiology

## 2023-10-13 NOTE — Telephone Encounter (Signed)
 Prescription sent to pharmacy.

## 2023-12-21 ENCOUNTER — Encounter: Payer: Self-pay | Admitting: Cardiology

## 2023-12-22 ENCOUNTER — Other Ambulatory Visit (HOSPITAL_COMMUNITY): Payer: Self-pay

## 2023-12-22 ENCOUNTER — Telehealth: Payer: Self-pay | Admitting: Pharmacy Technician

## 2023-12-22 NOTE — Telephone Encounter (Signed)
 This was completed  and denied- by ins. Working on an Electronics engineer

## 2024-01-04 ENCOUNTER — Other Ambulatory Visit (HOSPITAL_COMMUNITY): Payer: Self-pay

## 2024-01-04 NOTE — Telephone Encounter (Signed)
 Pharmacy Patient Advocate Encounter  Received notification from [facility] that Prior Authorization for amiodarone  100MG  has been APPROVED from 01/04/24 to 07/13/24. Ran test claim, Copay is $0.00- one month. This test claim was processed through [facility]- copay amounts may vary at other pharmacies due to pharmacy/plan contracts, or as the patient moves through the different stages of their insurance plan.   PA #/Case ID/Reference #: [id number]

## 2024-01-07 ENCOUNTER — Ambulatory Visit (INDEPENDENT_AMBULATORY_CARE_PROVIDER_SITE_OTHER): Admitting: Podiatry

## 2024-01-07 DIAGNOSIS — B351 Tinea unguium: Secondary | ICD-10-CM | POA: Diagnosis not present

## 2024-01-07 DIAGNOSIS — M79675 Pain in left toe(s): Secondary | ICD-10-CM

## 2024-01-07 DIAGNOSIS — M79674 Pain in right toe(s): Secondary | ICD-10-CM

## 2024-01-07 NOTE — Progress Notes (Signed)
 Subjective:  Patient ID: Rebecca Hoffman, female    DOB: Nov 04, 1940,  MRN: [id number]  [name] presents to clinic today for:  Chief Complaint  Patient presents with   RFC    RFC BLOOD THINNER   Patient notes nails are thick and elongated, causing pain in shoe gear when ambulating.    PCP is Goins, Darice BROCKS, FNP.  Last seen around 12/31/2023  Past Medical History:  Diagnosis Date   Acquired hypothyroidism 07/26/2020   Allergic rhinitis 01/24/2020   Anxiety    Anxiety and depression 01/24/2020   Arthritis    all over (04/29/2016)   At high risk for falls 03/19/2022   Formatting of this note might be different from the original. Uses Rollator and WC   Chronic atrial fibrillation with RVR (HCC) 01/24/2020   Chronic combined systolic and diastolic heart failure (HCC) 03/19/2022   Formatting of this note might be different from the original. 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 Etiology is unknown. See labs 03/2022   Localized edema 03/19/2022   Formatting of this note might be different from the original. Chronic   Malaise and fatigue 07/26/2020   Mixed hyperlipidemia 03/19/2022   Moderate major depression (HCC)    Morbid obesity (HCC)    Morbid obesity with BMI of 45.0-49.9, adult (HCC) 01/24/2020   Prediabetes 03/19/2022   Primary osteoarthritis involving multiple joints 01/29/2021   Formatting of this note might be different from the original. Formatting of this note might be different from the original. all over (04/29/2016)   Restless legs    UTI (urinary tract  infection) 04/29/2016   Noted on pre-op urine culture and u/a   Vitamin B 12 deficiency 03/22/2022   Vitamin D  deficiency 04/29/2016   Allergies  Allergen Reactions   Penicillins Shortness Of Breath    FLUSHING made face red   Objective:  Rebecca Hoffman is a pleasant 83 y.o. female in NAD. AAO x 3.  Vascular Examination: Patient has palpable DP pulse, absent PT pulse bilateral.  Delayed capillary refill bilateral toes.  Sparse digital hair bilateral.  Proximal to distal cooling WNL bilateral.    Dermatological Examination: Interspaces are clear with no open lesions noted bilateral.  Skin is shiny and atrophic bilateral.  Nails are 3-30mm thick, with yellowish/brown discoloration, subungual debris and distal onycholysis x10.  There is pain with compression of nails x10.    Patient qualifies for at-risk foot care because of PVD.  Assessment/Plan: 1. Pain due to onychomycosis of toenails of both feet    Mycotic nails x10 were sharply debrided with sterile nail nippers and power debriding burr to decrease bulk and length.  Return in about 3 months (around 04/08/2024) for RFC (sign Medicaid ABN if there are any calluses).   Awanda CHARM Imperial, DPM, FACFAS [facility]     [address]  [place], [place] [address]                Office [phone]  Fax [phone]

## 2024-02-18 ENCOUNTER — Ambulatory Visit: Admitting: Cardiology

## 2024-02-22 NOTE — Progress Notes (Signed)
 Cardiology Office Note   Date:  02/24/2024  ID:  Yalanda, Soderman 02-24-1941, MRN [id number] PCP: Jackolyn Darice BROCKS, FNP  [facility] Providers Cardiologist:  Redell Leiter, MD Cardiology APP:  Carlin Delon BROCKS, NP     History of Present Illness Rebecca Hoffman is a 83 y.o. female with a past medical history of CAD noted on CT imaging, atrial fibrillation on amiodarone  and Eliquis, RBBB, hypertension, HFmrEF, moderate mitral regurgitation.   06/30/2021 CT of the chest mild coronary artery calcifications 12/24/2019 echo EF 45 to 50%, mild aortic valve sclerosis, mild to moderate MR, mild to moderate TR  She is a longstanding patient of Dr. Leiter initially establishing care with him in 2022 for the management of her atrial fibrillation.  She previously been admitted to [facility] for A-fib with RVR, echocardiogram at that time in 2021 revealed an EF of 45 to 50%.  In 2022 she had a CT of her chest revealing mild coronary artery calcification.  She was most recently evaluated by Dr. Leiter on 08/17/2023, maintaining sinus rhythm, advised to follow-up in 6 months.  She presents today for follow up of her atria fibrillation. She is by herself, offers no formal complaints. She has had a few falls, but not due to dizziness or syncope. She lives alone, no living family, mentions that a friend drove her here today. She is no longer taking her diuretic, states she did not need it. Tolerating her Eliquis without any adverse bleeding effects. She denies chest pain, palpitations, dyspnea, pnd, orthopnea, n, v, dizziness, syncope, edema, weight gain, or early satiety.   ROS: Review of Systems  Musculoskeletal:  Positive for falls.  All other systems reviewed and are negative.    Studies Reviewed EKG Interpretation Date/Time:  Wednesday February 24 2024 13:37:28 EDT Ventricular Rate:  57 PR Interval:  250 QRS Duration:  148 QT Interval:  510 QTC Calculation: 496 R Axis:   -82  Text  Interpretation: Sinus bradycardia with 1st degree A-V block Left axis deviation Right bundle branch block Possible Lateral infarct (cited on or before 24-Feb-2024) When compared with ECG of 17-Aug-2023 16:19, Premature atrial complexes are no longer Present T wave inversion more evident in Inferior leads Confirmed by Carlin Delon [phone]) on 02/24/2024 1:42:10 PM        Risk Assessment/Calculations  CHA2DS2-VASc Score = 6   This indicates a 9.7% annual risk of stroke. The patient's score is based upon: CHF History: 1 HTN History: 1 Diabetes History: 0 Stroke History: 0 Vascular Disease History: 1 Age Score: 2 Gender Score: 1        Physical Exam VS:  BP (!) 163/83   Pulse (!) 57   Ht 5' 3 (1.6 m)   Wt 260 lb (117.9 kg)   SpO2 90%   BMI 46.06 kg/m        Wt Readings from Last 3 Encounters:  02/24/24 260 lb (117.9 kg)  08/17/23 257 lb 12.8 oz (116.9 kg)  04/23/23 255 lb 6.4 oz (115.8 kg)    GEN: no acute distress, appears to have some cognitive impairment NECK: No JVD; No carotid bruits CARDIAC: RRR, no murmurs, rubs, gallops RESPIRATORY:  Clear to auscultation without rales, wheezing or rhonchi  ABDOMEN: Soft, non-tender, non-distended EXTREMITIES:  No edema; No deformity   ASSESSMENT AND PLAN Paroxysmal atrial fibrillation/hypercoagulable state/on amiodarone  therapy- she is in sinus rhythm, tolerating her Eliquis without any adverse bleeding effects. Will repeat CBC, CMET and thyroid  panel. Continue Eliquis  5 mg twice daily, continue amiodarone  100 mg daily.   HFmrEF - NYHA class I-II, appears euvolemic but fine crackles appreciated will check proBNP since she has self d/c'd her diuretic. Continue losartan 25 mg daily, metoprolol  12.5 mg daily. Will repeat echo.   Moderate mitral regurgitation - overall, asymptomatic but she is relatively sedentary. Amenable to repeating echo for surveillance but she is adamant she would not want any type of intervention if it has  progressed.   Coronary artery calcifications-noted on CT imaging, Stable with no anginal symptoms. No indication for ischemic evaluation.    Hypertension-continue Cozaar 25 mg daily       Dispo: Labs per above, echo, follow up 6 months.   Signed, Delon JAYSON Hoover, NP

## 2024-02-24 ENCOUNTER — Encounter: Payer: Self-pay | Admitting: Cardiology

## 2024-02-24 ENCOUNTER — Ambulatory Visit: Attending: Cardiology | Admitting: Cardiology

## 2024-02-24 VITALS — BP 163/83 | HR 57 | Ht 63.0 in | Wt 260.0 lb

## 2024-02-24 DIAGNOSIS — I2510 Atherosclerotic heart disease of native coronary artery without angina pectoris: Secondary | ICD-10-CM | POA: Diagnosis not present

## 2024-02-24 DIAGNOSIS — Z79899 Other long term (current) drug therapy: Secondary | ICD-10-CM | POA: Diagnosis not present

## 2024-02-24 DIAGNOSIS — D6859 Other primary thrombophilia: Secondary | ICD-10-CM | POA: Diagnosis not present

## 2024-02-24 DIAGNOSIS — I10 Essential (primary) hypertension: Secondary | ICD-10-CM

## 2024-02-24 DIAGNOSIS — I340 Nonrheumatic mitral (valve) insufficiency: Secondary | ICD-10-CM

## 2024-02-24 DIAGNOSIS — I480 Paroxysmal atrial fibrillation: Secondary | ICD-10-CM

## 2024-02-24 NOTE — Patient Instructions (Signed)
 Medication Instructions:  Your physician recommends that you continue on your current medications as directed. Please refer to the Current Medication list given to you today.  *If you need a refill on your cardiac medications before your next appointment, please call your pharmacy*  Lab Work: Your physician recommends that you return for lab work in:   Labs today: CMP, CBC, TSH T3 T4, Pro BNP  If you have labs (blood work) drawn today and your tests are completely normal, you will receive your results only by: MyChart Message (if you have MyChart) OR A paper copy in the mail If you have any lab test that is abnormal or we need to change your treatment, we will call you to review the results.  Testing/Procedures: Your physician has requested that you have an echocardiogram. Echocardiography is a painless test that uses sound waves to create images of your heart. It provides your doctor with information about the size and shape of your heart and how well your heart's chambers and valves are working. This procedure takes approximately one hour. There are no restrictions for this procedure. Please do NOT wear cologne, perfume, aftershave, or lotions (deodorant is allowed). Please arrive 15 minutes prior to your appointment time.  Please note: We ask at that you not bring children with you during ultrasound (echo/ vascular) testing. Due to room size and safety concerns, children are not allowed in the ultrasound rooms during exams. Our front office staff cannot provide observation of children in our lobby area while testing is being conducted. An adult accompanying a patient to their appointment will only be allowed in the ultrasound room at the discretion of the ultrasound technician under special circumstances. We apologize for any inconvenience.   Follow-Up: At [facility], you and your health needs are our priority.  As part of our continuing mission to provide you with exceptional  heart care, our providers are all part of one team.  This team includes your primary Cardiologist (physician) and Advanced Practice Providers or APPs (Physician Assistants and Nurse Practitioners) who all work together to provide you with the care you need, when you need it.  Your next appointment:   6 month(s)  Provider:   Redell Leiter, MD    We recommend signing up for the patient portal called MyChart.  Sign up information is provided on this After Visit Summary.  MyChart is used to connect with patients for Virtual Visits (Telemedicine).  Patients are able to view lab/test results, encounter notes, upcoming appointments, etc.  Non-urgent messages can be sent to your provider as well.   To learn more about what you can do with MyChart, go to [email or web address].   Other Instructions None

## 2024-02-25 ENCOUNTER — Other Ambulatory Visit: Payer: Self-pay

## 2024-02-25 ENCOUNTER — Ambulatory Visit: Payer: Self-pay | Admitting: Cardiology

## 2024-02-25 DIAGNOSIS — I5042 Chronic combined systolic (congestive) and diastolic (congestive) heart failure: Secondary | ICD-10-CM

## 2024-02-25 LAB — PRO B NATRIURETIC PEPTIDE: NT-Pro BNP: 2406 pg/mL — ABNORMAL HIGH (ref 0–738)

## 2024-02-25 LAB — CBC
Hematocrit: 38.5 % (ref 34.0–46.6)
Hemoglobin: 11.7 g/dL (ref 11.1–15.9)
MCH: 28.5 pg (ref 26.6–33.0)
MCHC: 30.4 g/dL — ABNORMAL LOW (ref 31.5–35.7)
MCV: 94 fL (ref 79–97)
Platelets: 184 x10E3/uL (ref 150–450)
RBC: 4.11 x10E6/uL (ref 3.77–5.28)
RDW: 14.1 % (ref 11.7–15.4)
WBC: 7.3 x10E3/uL (ref 3.4–10.8)

## 2024-02-25 LAB — COMPREHENSIVE METABOLIC PANEL WITH GFR
ALT: 13 IU/L (ref 0–32)
AST: 16 IU/L (ref 0–40)
Albumin: 4.1 g/dL (ref 3.7–4.7)
Alkaline Phosphatase: 132 IU/L — ABNORMAL HIGH (ref 44–121)
BUN/Creatinine Ratio: 21 (ref 12–28)
BUN: 16 mg/dL (ref 8–27)
Bilirubin Total: 0.5 mg/dL (ref 0.0–1.2)
CO2: 20 mmol/L (ref 20–29)
Calcium: 8.8 mg/dL (ref 8.7–10.3)
Chloride: 105 mmol/L (ref 96–106)
Creatinine, Ser: 0.76 mg/dL (ref 0.57–1.00)
Globulin, Total: 2.6 g/dL (ref 1.5–4.5)
Glucose: 90 mg/dL (ref 70–99)
Potassium: 4.6 mmol/L (ref 3.5–5.2)
Sodium: 141 mmol/L (ref 134–144)
Total Protein: 6.7 g/dL (ref 6.0–8.5)
eGFR: 77 mL/min/1.73

## 2024-02-25 LAB — TSH+T4F+T3FREE
Free T4: 2.08 ng/dL — ABNORMAL HIGH (ref 0.82–1.77)
T3, Free: 1.7 pg/mL — ABNORMAL LOW (ref 2.0–4.4)
TSH: 1.67 u[IU]/mL (ref 0.450–4.500)

## 2024-02-25 MED ORDER — BUMETANIDE 2 MG PO TABS: 2.0000 mg | ORAL_TABLET | ORAL | 3 refills | Status: AC

## 2024-03-08 ENCOUNTER — Telehealth: Payer: Self-pay | Admitting: Cardiology

## 2024-03-08 NOTE — Telephone Encounter (Signed)
 Called the patient and she reported that her blood pressure was elevated: 180/67 and 158/82. Patient states that she has no other symptoms at this time. Patient was instructed to check her blood pressure everyday for the next week, two hours after she takes her blood pressure medication. Then send the list of blood pressures through MyChart or drop them off at the office. Patient also stated that she had a wrist blood pressure device. She was instructed to purchase an upper arm Omron blood pressure device as well if possible for more accurate readings. Patient verbalized understanding and had no further questions at this time.

## 2024-03-08 NOTE — Telephone Encounter (Signed)
 Pt c/o BP issue: STAT if pt c/o blurred vision, one-sided weakness or slurred speech  1. What are your last 5 BP readings? 180/67; 158/82 HT  2. Are you having any other symptoms (ex. Dizziness, headache, blurred vision, passed out)? No   3. What is your BP issue? Patient states that is running high

## 2024-03-11 ENCOUNTER — Telehealth: Payer: Self-pay | Admitting: Cardiology

## 2024-03-11 ENCOUNTER — Ambulatory Visit: Payer: Self-pay | Admitting: Cardiology

## 2024-03-11 LAB — BASIC METABOLIC PANEL WITH GFR
BUN/Creatinine Ratio: 19 (ref 12–28)
BUN: 14 mg/dL (ref 8–27)
CO2: 22 mmol/L (ref 20–29)
Calcium: 9 mg/dL (ref 8.7–10.3)
Chloride: 103 mmol/L (ref 96–106)
Creatinine, Ser: 0.73 mg/dL (ref 0.57–1.00)
Glucose: 127 mg/dL — ABNORMAL HIGH (ref 70–99)
Potassium: 4.3 mmol/L (ref 3.5–5.2)
Sodium: 142 mmol/L (ref 134–144)
eGFR: 81 mL/min/1.73

## 2024-03-11 LAB — PRO B NATRIURETIC PEPTIDE: NT-Pro BNP: 774 pg/mL — ABNORMAL HIGH (ref 0–738)

## 2024-03-11 NOTE — Telephone Encounter (Signed)
 Pt states she got a call this morning from Charlie Free and is returning his call. Please advise.

## 2024-03-18 ENCOUNTER — Ambulatory Visit

## 2024-03-18 NOTE — Telephone Encounter (Signed)
 Left message for the patient to call back.

## 2024-03-18 NOTE — Telephone Encounter (Signed)
 Patient is returning call. Please advise?

## 2024-03-18 NOTE — Telephone Encounter (Signed)
 Patient informed of results below:  Labs are stable. Continue current medications.  Patient verbalized understanding and had no further questions at this time.

## 2024-04-07 ENCOUNTER — Ambulatory Visit: Admitting: Podiatry

## 2024-04-07 DIAGNOSIS — M79675 Pain in left toe(s): Secondary | ICD-10-CM

## 2024-04-07 DIAGNOSIS — B351 Tinea unguium: Secondary | ICD-10-CM

## 2024-04-07 DIAGNOSIS — M79674 Pain in right toe(s): Secondary | ICD-10-CM

## 2024-04-07 NOTE — Progress Notes (Signed)
 Subjective:  Patient ID: Rebecca Hoffman, female    DOB: Sep 06, 1940,  MRN: [id number]  Rebecca Hoffman presents to clinic today for:  Chief Complaint  Patient presents with   RFC    RFC nails only Not diabetic and takes Eliquis   Patient notes nails are thick and elongated, causing pain in shoe gear when ambulating.    PCP is Goins, Darice BROCKS, FNP.  Last seen around 12/31/2023  Past Medical History:  Diagnosis Date   Acquired hypothyroidism 07/26/2020   Allergic rhinitis 01/24/2020   Anxiety    Anxiety and depression 01/24/2020   Arthritis    all over (04/29/2016)   At high risk for falls 03/19/2022   Formatting of this note might be different from the original. Uses Rollator and WC   Chronic atrial fibrillation with RVR (HCC) 01/24/2020   Chronic combined systolic and diastolic heart failure (HCC) 03/19/2022   Formatting of this note might be different from the original. 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 Etiology is unknown. See labs 03/2022   Localized edema 03/19/2022   Formatting of this note might be different from the original. Chronic   Malaise and fatigue 07/26/2020   Mixed hyperlipidemia 03/19/2022   Moderate major depression (HCC)    Morbid obesity (HCC)    Morbid obesity with BMI of 45.0-49.9, adult (HCC) 01/24/2020   Prediabetes 03/19/2022   Primary osteoarthritis involving multiple joints 01/29/2021   Formatting of this note might be different from the original. Formatting of this note might be different from the original. all over (04/29/2016)   Restless  legs    UTI (urinary tract infection) 04/29/2016   Noted on pre-op urine culture and u/a   Vitamin B 12 deficiency 03/22/2022   Vitamin D  deficiency 04/29/2016   Allergies  Allergen Reactions   Penicillins Shortness Of Breath    FLUSHING made face red   Objective:  Rebecca Hoffman is a pleasant 83 y.o. female in NAD. AAO x 3.  Vascular Examination: Patient has palpable DP pulse, absent PT pulse bilateral.  Delayed capillary refill bilateral toes.  Sparse digital hair bilateral.  Proximal to distal cooling WNL bilateral.    Dermatological Examination: Interspaces are clear with no open lesions noted bilateral.  Skin is shiny and atrophic bilateral.  Nails are 3-63mm thick, with yellowish/brown discoloration, subungual debris and distal onycholysis x10.  There is pain with compression of nails x10.    Patient qualifies for at-risk foot care because of PVD.  Assessment/Plan: 1. Pain due to onychomycosis of toenails of both feet    Mycotic nails x10 were sharply debrided with sterile nail nippers and power debriding burr to decrease bulk and length.  Return in about 3 months (around 07/07/2024) for RFC.   Rebecca Hoffman, DPM, FACFAS [facility]     [address]  [place], [place] [address]                Office [phone]  Fax [phone]

## 2024-06-17 ENCOUNTER — Ambulatory Visit: Admitting: Cardiology

## 2024-06-27 ENCOUNTER — Telehealth: Payer: Self-pay | Admitting: Cardiology

## 2024-06-27 NOTE — Telephone Encounter (Signed)
 Pt c/o BP issue: STAT if pt c/o blurred vision, one-sided weakness or slurred speech.   STAT if BP is GREATER than 180/120 TODAY.   STAT if BP is LESS than 90/60 and SYMPTOMATIC TODAY   1. What is your BP concern? Elevated    2. Have you taken any BP medication today?Yes   3. What are your last 5 BP readings? Today 141/73, PT claims that it is the lowest it has been    4. Are you having any other symptoms (ex. Dizziness, headache, blurred vision, passed out)? No

## 2024-06-27 NOTE — Progress Notes (Unsigned)
 Cardiology Office Note:    Date:  06/28/2024   ID:  Rebecca Hoffman, DOB [birth year], MRN [id number]  PCP:  Jackolyn Darice BROCKS, FNP  Cardiologist:  Redell Leiter, MD    Referring MD: Jackolyn Darice BROCKS, FNP    ASSESSMENT:    1. Paroxysmal atrial fibrillation (HCC)   2. On amiodarone  therapy   3. Long term current use of anticoagulant therapy   4. Chronic combined systolic and diastolic heart failure (HCC)   5. Mitral valve insufficiency, unspecified etiology   6. RBBB with left anterior fascicular block    PLAN:    In order of problems listed above:  Doing well maintaining sinus rhythm with low-dose amiodarone  and tolerates her anticoag no bleeding no changes Stable EKG pattern Stable heart failure continue her current loop diuretic and antihypertensives discussed with her technique for eval and blood pressure determination Stable mitral regurgitation Stable EKG conduction delay   Next appointment: 6 months   Medication Adjustments/Labs and Tests Ordered: Current medicines are reviewed at length with the patient today.  Concerns regarding medicines are outlined above.  Orders Placed This Encounter  Procedures   EKG 12-Lead   No orders of the defined types were placed in this encounter.    History of Present Illness:    Rebecca Hoffman is a 83 y.o. female with a hx of paroxysmal atrial fibrillation on amiodarone  therapy and chronic anticoagulation left bundle branch block chronic systolic and diastolic heart failure most recent ejection fraction 45 to 50% mitral regurgitation and small pericardial effusion last seen by me 08/17/2023.  Compliance with diet, lifestyle and medications: Yes  She is concerned getting high and erratic blood pressure measurements she is using a wrist cuff will not use an arm and I discussed with her keeping it at least at the level of the heart I do not think we need to increase her antihypertensive agents She is not having edema or shortness of  breath She has had no bleeding from her anticoagulant She is on amiodarone  and we are to go ahead and check full labs today Past Medical History:  Diagnosis Date   Acquired hypothyroidism 07/26/2020   Adjustment disorder with depressed mood 03/06/2022   Allergic rhinitis 01/24/2020   Anemia 09/01/2022   Anxiety    Anxiety and depression 01/24/2020   Arthritis    all over (04/29/2016)   Arthropathy of left knee 02/28/2022   At high risk for falls 03/19/2022   Formatting of this note might be different from the original. Uses Rollator and WC   Cellulitis of left lower limb 03/04/2022   Chronic atrial fibrillation with RVR (HCC) 01/24/2020   Chronic combined systolic and diastolic heart failure (HCC) 03/19/2022   Formatting of this note might be different from the original. 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 Etiology is unknown. See labs 03/2022   Localized edema 03/19/2022   Formatting of this note might be different from the original. Chronic   Long term current use of antibiotics 02/28/2022   Long term current use of anticoagulant therapy 02/28/2022   Malaise and fatigue 07/26/2020   Mixed hyperlipidemia 03/19/2022   Moderate major depression  (HCC)    Morbid obesity (HCC)    Morbid obesity with BMI of 45.0-49.9, adult (HCC) 01/24/2020   Muscle weakness 02/28/2022   Oxygen dependent 02/28/2022   Prediabetes 03/19/2022   Primary osteoarthritis involving multiple joints 01/29/2021   Formatting of this note might be different from the original. Formatting of this note might be different from the original. 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   Unspecified dementia, unspecified severity, with agitation (HCC) 09/05/2022   Unsteadiness on feet 09/02/2022   UTI (urinary tract infection) 04/29/2016   Noted on pre-op urine culture and u/a   Vitamin B 12 deficiency 03/22/2022   Vitamin D  deficiency 04/29/2016    Current Medications: Active Medications[1]    EKGs/Labs/Other Studies Reviewed:    The following studies were reviewed today:      EKG Interpretation Date/Time:  Tuesday June 28 2024 13:47:44 EST Ventricular Rate:  73 PR Interval:  254 QRS Duration:  134 QT Interval:  434 QTC Calculation: 478 R Axis:   262  Text Interpretation: Sinus rhythm with 1st degree A-V block Right bundle branch block Inferior infarct , age undetermined Anterolateral infarct (cited on or before 24-Feb-2024) When compared with ECG of 24-Feb-2024 13:37, No significant change since Confirmed by Monetta Rogue ([id number]) on 06/28/2024 2:03:48 PM  EKG with the same pattern sinus rhythm bifascicular heart no significant change  Recent Labs: 02/24/2024: ALT 13; Hemoglobin 11.7; Platelets 184; TSH 1.670 03/10/2024: BUN 14; Creatinine, Ser 0.73; NT-Pro BNP 774; Potassium 4.3; Sodium 142  Recent Lipid Panel    Component Value Date/Time   CHOL 140 12/18/2021 1549   TRIG 58 12/18/2021 1549   HDL 67  12/18/2021 1549   CHOLHDL 2.1 12/18/2021 1549   LDLCALC 61 12/18/2021 1549    Physical Exam:    VS:  BP (!) 148/80   Pulse 73   Ht 5' 3 (1.6 m)   Wt 245 lb 3.2 oz (111.2 kg)   SpO2 93%   BMI 43.44 kg/m     Wt Readings from Last 3 Encounters:  06/28/24 245 lb 3.2 oz (111.2 kg)  02/24/24 260 lb (117.9 kg)  08/17/23 257 lb 12.8 oz (116.9 kg)     GEN:  Well nourished, well developed in no acute distress HEENT: Normal NECK: No JVD; No carotid bruits LYMPHATICS: No lymphadenopathy CARDIAC: RRR, no murmurs, rubs, gallops RESPIRATORY:  Clear to auscultation without rales, wheezing or rhonchi  ABDOMEN: Soft, non-tender, non-distended MUSCULOSKELETAL:  No edema; No deformity  SKIN: Warm and dry NEUROLOGIC:  Alert and oriented x 3 PSYCHIATRIC:  Normal affect    Signed, Rogue Monetta, MD  06/28/2024 2:04 PM    [facility]      [1]  Current Meds  Medication Sig   acetaminophen  (TYLENOL ) 500 MG tablet Take 1-2 tablets (500-1,000 mg total) by mouth every 6 (six) hours as  needed for mild pain (or Fever >/= 101).   albuterol (VENTOLIN HFA) 108 (90 Base) MCG/ACT inhaler Inhale 2 puffs into the lungs every 4 (four) hours as needed for wheezing or shortness of breath.   alum & mag hydroxide-simeth (MAALOX/MYLANTA) 200-200-20 MG/5 SUSP Apply 30 mLs topically as needed (Indigestion).   amiodarone  (PACERONE ) 100 MG tablet Take 100 mg by mouth daily.   bumetanide  (BUMEX ) 2 MG tablet Take 1 tablet (2 mg total) by mouth every other day.   ELIQUIS 5 MG TABS tablet Take 5 mg by mouth 2 (two) times daily.   escitalopram (LEXAPRO) 10 MG tablet Take 10 mg by mouth daily.   levothyroxine (SYNTHROID) 150 MCG tablet Take 150 mcg by mouth daily before breakfast.   losartan (COZAAR) 25 MG tablet Take 25 mg by mouth daily.   montelukast (SINGULAIR) 10 MG tablet Take 10 mg by mouth daily.   ondansetron  (ZOFRAN ) 4 MG tablet Take 4 mg by mouth every 8 (eight) hours as needed for  nausea or vomiting.   potassium chloride  SA (KLOR-CON  M) 20 MEQ tablet Take 1 tablet (20 mEq total) by mouth daily.   rOPINIRole (REQUIP) 0.5 MG tablet Take 0.5 mg by mouth 3 (three) times daily.   Vitamin D , Cholecalciferol , 25 MCG (1000 UT) TABS Take 2 tablets by mouth daily.

## 2024-06-28 ENCOUNTER — Ambulatory Visit: Attending: Cardiology | Admitting: Cardiology

## 2024-06-28 ENCOUNTER — Encounter: Payer: Self-pay | Admitting: Cardiology

## 2024-06-28 VITALS — BP 148/80 | HR 73 | Ht 63.0 in | Wt 245.2 lb

## 2024-06-28 DIAGNOSIS — Z79899 Other long term (current) drug therapy: Secondary | ICD-10-CM

## 2024-06-28 DIAGNOSIS — I5042 Chronic combined systolic (congestive) and diastolic (congestive) heart failure: Secondary | ICD-10-CM

## 2024-06-28 DIAGNOSIS — I480 Paroxysmal atrial fibrillation: Secondary | ICD-10-CM

## 2024-06-28 DIAGNOSIS — I340 Nonrheumatic mitral (valve) insufficiency: Secondary | ICD-10-CM

## 2024-06-28 DIAGNOSIS — I452 Bifascicular block: Secondary | ICD-10-CM

## 2024-06-28 DIAGNOSIS — Z7901 Long term (current) use of anticoagulants: Secondary | ICD-10-CM

## 2024-06-28 NOTE — Addendum Note (Signed)
 Addended by: SHERRE ADE I on: 06/28/2024 02:15 PM   Modules accepted: Orders

## 2024-06-28 NOTE — Patient Instructions (Addendum)
 Medication Instructions:  Your physician recommends that you continue on your current medications as directed. Please refer to the Current Medication list given to you today.  *If you need a refill on your cardiac medications before your next appointment, please call your pharmacy*  Lab Work: Your physician recommends that you return for lab work in:   Labs today: CMP, TSH T3 T4, CBC, Pro BNP  If you have labs (blood work) drawn today and your tests are completely normal, you will receive your results only by: MyChart Message (if you have MyChart) OR A paper copy in the mail If you have any lab test that is abnormal or we need to change your treatment, we will call you to review the results.  Testing/Procedures: None  Follow-Up: At [facility], you and your health needs are our priority.  As part of our continuing mission to provide you with exceptional heart care, our providers are all part of one team.  This team includes your primary Cardiologist (physician) and Advanced Practice Providers or APPs (Physician Assistants and Nurse Practitioners) who all work together to provide you with the care you need, when you need it.  Your next appointment:   6 month(s)  Provider:   Redell Leiter, MD    We recommend signing up for the patient portal called MyChart.  Sign up information is provided on this After Visit Summary.  MyChart is used to connect with patients for Virtual Visits (Telemedicine).  Patients are able to view lab/test results, encounter notes, upcoming appointments, etc.  Non-urgent messages can be sent to your provider as well.   To learn more about what you can do with MyChart, go to [email or web address].   Other Instructions None       [facility]  Tips to measure your blood pressure correctly  To determine whether you have hypertension, a medical professional will take a blood pressure reading. How you prepare for the test, the position of your arm,  and other factors can change a blood pressure reading by 10% or more. That could be enough to hide high blood pressure, start you on a drug you don't really need, or lead your doctor to incorrectly adjust your medications. National and international guidelines offer specific instructions for measuring blood pressure. If a doctor, nurse, or medical assistant isn't doing it right, don't hesitate to ask him or her to get with the guidelines. Here's what you can do to ensure a correct reading:  Don't drink a caffeinated beverage or smoke during the 30 minutes before the test.  Sit quietly for five minutes before the test begins.  During the measurement, sit in a chair with your feet on the floor and your arm supported so your elbow is at about heart level.  The inflatable part of the cuff should completely cover at least 80% of your upper arm, and the cuff should be placed on bare skin, not over a shirt.  Don't talk during the measurement.  Have your blood pressure measured twice, with a brief break in between. If the readings are different by 5 points or more, have it done a third time. There are times to break these rules. If you sometimes feel lightheaded when getting out of bed in the morning or when you stand after sitting, you should have your blood pressure checked while seated and then while standing to see if it falls from one position to the next. Because blood pressure varies throughout the day, your doctor will rarely  diagnose hypertension on the basis of a single reading. Instead, he or she will want to confirm the measurements on at least two occasions, usually within a few weeks of one another. The exception to this rule is if you have a blood pressure reading of 180/110 mm Hg or higher. A result this high usually calls for prompt treatment. It's also a good idea to have your blood pressure measured in both arms at least once, since the reading in one arm (usually the right) may be higher than  that in the left. A 2014 study in The American Journal of Medicine of nearly 3,400 people found average arm- to-arm differences in systolic blood pressure of about 5 points. The higher number should be used to make treatment decisions. In 2017, new guidelines from the American Heart Association, the [employer] of Cardiology, and nine other health organizations lowered the diagnosis of high blood pressure to 130/80 mm Hg or higher for all adults. The guidelines also redefined the various blood pressure categories to now include normal, elevated, Stage 1 hypertension, Stage 2 hypertension, and hypertensive crisis (see Blood pressure categories). Blood pressure categories  Blood pressure category SYSTOLIC (upper number)  DIASTOLIC (lower number)  Normal Less than 120 mm Hg and Less than 80 mm Hg  Elevated 120-129 mm Hg and Less than 80 mm Hg  High blood pressure: Stage 1 hypertension 130-139 mm Hg or 80-89 mm Hg  High blood pressure: Stage 2 hypertension 140 mm Hg or higher or 90 mm Hg or higher  Hypertensive crisis (consult your doctor immediately) Higher than 180 mm Hg and/or Higher than 120 mm Hg  Source: American Heart Association and American Stroke Association. For more on getting your blood pressure under control, buy Controlling Your Blood Pressure, a Special Health Report from [facility].

## 2024-06-28 NOTE — Telephone Encounter (Signed)
 Patient had an appointment today with Dr. Monetta and her question was addressed during the appointment.

## 2024-06-29 ENCOUNTER — Ambulatory Visit: Payer: Self-pay | Admitting: Cardiology

## 2024-06-29 LAB — CBC
Hematocrit: 42.1 % (ref 34.0–46.6)
Hemoglobin: 13.3 g/dL (ref 11.1–15.9)
MCH: 29.2 pg (ref 26.6–33.0)
MCHC: 31.6 g/dL (ref 31.5–35.7)
MCV: 92 fL (ref 79–97)
Platelets: 193 x10E3/uL (ref 150–450)
RBC: 4.56 x10E6/uL (ref 3.77–5.28)
RDW: 13.6 % (ref 11.7–15.4)
WBC: 7 x10E3/uL (ref 3.4–10.8)

## 2024-06-29 LAB — COMPREHENSIVE METABOLIC PANEL WITH GFR
ALT: 13 IU/L (ref 0–32)
AST: 15 IU/L (ref 0–40)
Albumin: 4.2 g/dL (ref 3.7–4.7)
Alkaline Phosphatase: 125 IU/L (ref 48–129)
BUN/Creatinine Ratio: 21 (ref 12–28)
BUN: 14 mg/dL (ref 8–27)
Bilirubin Total: 0.4 mg/dL (ref 0.0–1.2)
CO2: 22 mmol/L (ref 20–29)
Calcium: 8.8 mg/dL (ref 8.7–10.3)
Chloride: 104 mmol/L (ref 96–106)
Creatinine, Ser: 0.68 mg/dL (ref 0.57–1.00)
Globulin, Total: 2.5 g/dL (ref 1.5–4.5)
Glucose: 102 mg/dL — ABNORMAL HIGH (ref 70–99)
Potassium: 4.6 mmol/L (ref 3.5–5.2)
Sodium: 139 mmol/L (ref 134–144)
Total Protein: 6.7 g/dL (ref 6.0–8.5)
eGFR: 85 mL/min/1.73 (ref >59)

## 2024-06-29 LAB — TSH+T4F+T3FREE
Free T4: 1.99 ng/dL — ABNORMAL HIGH (ref 0.82–1.77)
T3, Free: 2.3 pg/mL (ref 2.0–4.4)
TSH: 0.621 u[IU]/mL (ref 0.450–4.500)

## 2024-06-29 LAB — PRO B NATRIURETIC PEPTIDE: NT-Pro BNP: 468 pg/mL (ref 0–738)

## 2024-06-30 ENCOUNTER — Ambulatory Visit: Admitting: Podiatry

## 2024-07-25 ENCOUNTER — Encounter: Payer: Self-pay | Admitting: Cardiology

## 2024-08-01 ENCOUNTER — Ambulatory Visit: Admitting: Podiatry

## 2024-08-01 DIAGNOSIS — M79675 Pain in left toe(s): Secondary | ICD-10-CM | POA: Diagnosis not present

## 2024-08-01 DIAGNOSIS — B351 Tinea unguium: Secondary | ICD-10-CM

## 2024-08-01 DIAGNOSIS — M79674 Pain in right toe(s): Secondary | ICD-10-CM | POA: Diagnosis not present

## 2024-08-01 NOTE — Progress Notes (Unsigned)
 ABN not signed for callus shaving  Nails x 10.  She felt like she had a hangnail on the right fifth toenail lateral border.  This was cut back but nothing was really seen.

## 2024-08-26 ENCOUNTER — Ambulatory Visit: Admitting: Cardiology

## 2024-10-31 ENCOUNTER — Ambulatory Visit: Admitting: Podiatry
